# Patient Record
Sex: Female | Born: 1995 | Race: White | Hispanic: No | Marital: Single | Smoking: Never smoker
Health system: Southern US, Community
[De-identification: ages and names within clinical notes are randomized; demographics above are authoritative.]

## PROBLEM LIST (undated history)

## (undated) DIAGNOSIS — F909 Attention-deficit hyperactivity disorder, unspecified type: Secondary | ICD-10-CM

## (undated) DIAGNOSIS — F419 Anxiety disorder, unspecified: Secondary | ICD-10-CM

## (undated) HISTORY — PX: WISDOM TOOTH EXTRACTION: SHX21

## (undated) HISTORY — DX: Attention-deficit hyperactivity disorder, unspecified type: F90.9

---
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
## 2017-01-11 ENCOUNTER — Encounter: Payer: Self-pay | Admitting: Family Medicine

## 2017-01-11 ENCOUNTER — Ambulatory Visit (INDEPENDENT_AMBULATORY_CARE_PROVIDER_SITE_OTHER): Payer: Commercial Managed Care - PPO | Admitting: Family Medicine

## 2017-01-11 VITALS — BP 135/77 | HR 104 | Temp 97.9°F | Ht 66.0 in | Wt 155.0 lb

## 2017-01-11 DIAGNOSIS — J020 Streptococcal pharyngitis: Secondary | ICD-10-CM

## 2017-01-11 LAB — RAPID STREP SCREEN (MED CTR MEBANE ONLY): STREP GP A AG, IA W/REFLEX: POSITIVE — AB

## 2017-01-11 MED ORDER — AMOXICILLIN 500 MG PO CAPS: 500.0000 mg | ORAL_CAPSULE | Freq: Two times a day (BID) | ORAL | 0 refills | Status: AC

## 2017-01-11 NOTE — Progress Notes (Signed)
Subjective: CC: flu like symptoms PCP: Bennie PieriniMartin, Mary-Margaret, FNP WUJ:[id number]:Ruth Petty is a 22 y.o. female, who is accompanied today by her father. She is presenting to clinic today for:  1. Sore throat Patient reports acute onset of sore throat, sinus pressure, and congestion yesterday.  She denies nausea, vomiting, diarrhea, sick contacts, fevers, chills, rashes, cough, dizziness.  She has not taken anything for symptoms yet.  No history of recurrent strep throat.  She is not a smoker.  No smoke exposure.  She is hydrating well and swallowing without difficulty.   ROS: Per HPI  No Known Allergies Past Medical History:  Diagnosis Date  . ADHD (attention deficit hyperactivity disorder)     Current Outpatient Medications:  .  adapalene (DIFFERIN) 0.1 % cream, , Disp: , Rfl: 3 .  amphetamine-dextroamphetamine (ADDERALL XR) 30 MG 24 hr capsule, Take  1 capsule at 7am and one at 1pm daily, Disp: 60 capsule, Rfl: 0 .  amphetamine-dextroamphetamine (ADDERALL XR) 30 MG 24 hr capsule, Take  1 capsule at 7am and one at 1pm daily, Disp: 60 capsule, Rfl: 0 .  amphetamine-dextroamphetamine (ADDERALL XR) 30 MG 24 hr capsule, Take  1 capsule at 7am and one at 1pm daily, Disp: 60 capsule, Rfl: 0 .  busPIRone (BUSPAR) 15 MG tablet, TAKE 1 TABLET (15 MG TOTAL) BY MOUTH 2 (TWO) TIMES DAILY., Disp: 60 tablet, Rfl: 2 .  citalopram (CELEXA) 40 MG tablet, Take 1 tablet (40 mg total) by mouth daily., Disp: 90 tablet, Rfl: 1 .  norethindrone-ethinyl estradiol (JUNEL FE,GILDESS FE,LOESTRIN FE) 1-20 MG-MCG tablet, Take 1 tablet by mouth daily., Disp: 1 Package, Rfl: 5 .  spironolactone (ALDACTONE) 25 MG tablet, Take 25 mg by mouth 2 (two) times daily., Disp: , Rfl: 3 Social History   Socioeconomic History  . Marital status: Single    Spouse name: Not on file  . Number of children: Not on file  . Years of education: Not on file  . Highest education level: Not on file  Social Needs  . Financial resource  strain: Not on file  . Food insecurity - worry: Not on file  . Food insecurity - inability: Not on file  . Transportation needs - medical: Not on file  . Transportation needs - non-medical: Not on file  Occupational History  . Not on file  Tobacco Use  . Smoking status: Never Smoker  . Smokeless tobacco: Never Used  Substance and Sexual Activity  . Alcohol use: No  . Drug use: No  . Sexual activity: Not on file  Other Topics Concern  . Not on file  Social History Narrative  . Not on file   No family history on file.  Objective: Office vital signs reviewed. BP 135/77   Pulse (!) 104   Temp 97.9 F (36.6 C) (Oral)   Ht 5\' 6"  (1.676 m)   Wt 155 lb (70.3 kg)   BMI 25.02 kg/m   Physical Examination:  General: Awake, alert, well nourished, well appearing female, No acute distress HEENT: Normal    Neck: No masses palpated. No lymphadenopathy    Ears: Tympanic membranes intact, normal light reflex, no erythema, no bulging    Eyes: PERRLA, extraocular membranes intact, sclera white, no ocular discharge    Nose: nasal turbinates moist, clear nasal discharge    Throat: moist mucus membranes, mild oropharyngeal erythema, no tonsillar exudate.  Airway is patent Cardio: slightly tachycardic and rhythm, S1S2 heard, no murmurs appreciated Pulm: clear to auscultation bilaterally,  no wheezes, rhonchi or rales; normal work of breathing on room air Skin: good turgor, no rashes  Assessment/ Plan: 22 y.o. female   1. Strep pharyngitis Rapid strep is positive.  Patient is nontoxic.  She actually had a positive strep in September as well.  I did discuss with her she continues to have positive strep throat, this would be an indication to refer to ENT for consideration of removal of tonsils.  She voiced understanding.  She has been prescribed amoxicillin 500 mg twice daily for 10 days.  She will follow-up as needed. - Rapid Strep Screen (Not at [facility])   Orders Placed This Encounter    Procedures  . Rapid Strep Screen (Not at [facility])   Meds ordered this encounter  Medications  . amoxicillin (AMOXIL) 500 MG capsule    Sig: Take 1 capsule (500 mg total) by mouth 2 (two) times daily for 10 days.    Dispense:  20 capsule    Refill:  0   Strict return precautions and reasons for emergent evaluation in the emergency department review with patient.  They voiced understanding and will follow-up as needed.    Raliegh Ip, DO [facility] [phone]

## 2017-01-11 NOTE — Patient Instructions (Addendum)
Your strep test was positive.  This infection is highly contagious.  You will be considered not contagious after you have taken antibiotics for 24 hours.  I have sent in antibiotics for you to use.  You may use ibuprofen or naproxen if needed for fever or sore throat.  Make sure that you take this medication with food and water.    Strep Throat Strep throat is a bacterial infection of the throat. Your health care provider may call the infection tonsillitis or pharyngitis, depending on whether there is swelling in the tonsils or at the back of the throat. Strep throat is most common during the cold months of the year in children who are 175-20 years of age, but it can happen during any season in people of any age. This infection is spread from person to person (contagious) through coughing, sneezing, or close contact. What are the causes? Strep throat is caused by the bacteria called Streptococcus pyogenes. What increases the risk? This condition is more likely to develop in:  People who spend time in crowded places where the infection can spread easily.  People who have close contact with someone who has strep throat.  What are the signs or symptoms? Symptoms of this condition include:  Fever or chills.  Redness, swelling, or pain in the tonsils or throat.  Pain or difficulty when swallowing.  White or yellow spots on the tonsils or throat.  Swollen, tender glands in the neck or under the jaw.  Red rash all over the body (rare).  How is this diagnosed? This condition is diagnosed by performing a rapid strep test or by taking a swab of your throat (throat culture test). Results from a rapid strep test are usually ready in a few minutes, but throat culture test results are available after one or two days. How is this treated? This condition is treated with antibiotic medicine. Follow these instructions at home: Medicines  Take over-the-counter and prescription medicines only as told  by your health care provider.  Take your antibiotic as told by your health care provider. Do not stop taking the antibiotic even if you start to feel better.  Have family members who also have a sore throat or fever tested for strep throat. They may need antibiotics if they have the strep infection. Eating and drinking  Do not share food, drinking cups, or personal items that could cause the infection to spread to other people.  If swallowing is difficult, try eating soft foods until your sore throat feels better.  Drink enough fluid to keep your urine clear or pale yellow. General instructions  Gargle with a salt-water mixture 3-4 times per day or as needed. To make a salt-water mixture, completely dissolve -1 tsp of salt in 1 cup of warm water.  Make sure that all household members wash their hands well.  Get plenty of rest.  Stay home from school or work until you have been taking antibiotics for 24 hours.  Keep all follow-up visits as told by your health care provider. This is important. Contact a health care provider if:  The glands in your neck continue to get bigger.  You develop a rash, cough, or earache.  You cough up a thick liquid that is green, yellow-brown, or bloody.  You have pain or discomfort that does not get better with medicine.  Your problems seem to be getting worse rather than better.  You have a fever. Get help right away if:  You have new  symptoms, such as vomiting, severe headache, stiff or painful neck, chest pain, or shortness of breath.  You have severe throat pain, drooling, or changes in your voice.  You have swelling of the neck, or the skin on the neck becomes red and tender.  You have signs of dehydration, such as fatigue, dry mouth, and decreased urination.  You become increasingly sleepy, or you cannot wake up completely.  Your joints become red or painful. This information is not intended to replace advice given to you by your  health care provider. Make sure you discuss any questions you have with your health care provider. Document Released: 12/23/1999 Document Revised: 08/24/2015 Document Reviewed: 04/19/2014 Elsevier Interactive Patient Education  [employer].

## 2017-02-13 ENCOUNTER — Ambulatory Visit: Payer: Commercial Managed Care - PPO | Admitting: Physician Assistant

## 2017-02-19 ENCOUNTER — Encounter: Payer: Self-pay | Admitting: Nurse Practitioner

## 2017-03-25 ENCOUNTER — Other Ambulatory Visit: Payer: Self-pay | Admitting: Nurse Practitioner

## 2017-03-26 ENCOUNTER — Ambulatory Visit (INDEPENDENT_AMBULATORY_CARE_PROVIDER_SITE_OTHER): Payer: Commercial Managed Care - PPO | Admitting: Nurse Practitioner

## 2017-03-26 ENCOUNTER — Encounter: Payer: Self-pay | Admitting: Nurse Practitioner

## 2017-03-26 VITALS — BP 120/77 | HR 135 | Temp 97.0°F | Ht 67.0 in | Wt 157.0 lb

## 2017-03-26 DIAGNOSIS — F331 Major depressive disorder, recurrent, moderate: Secondary | ICD-10-CM | POA: Diagnosis not present

## 2017-03-26 DIAGNOSIS — F902 Attention-deficit hyperactivity disorder, combined type: Secondary | ICD-10-CM | POA: Diagnosis not present

## 2017-03-26 DIAGNOSIS — F411 Generalized anxiety disorder: Secondary | ICD-10-CM | POA: Diagnosis not present

## 2017-03-26 MED ORDER — AMPHETAMINE-DEXTROAMPHET ER 30 MG PO CP24: ORAL_CAPSULE | ORAL | 0 refills | Status: DC

## 2017-03-26 MED ORDER — CITALOPRAM HYDROBROMIDE 40 MG PO TABS: 40.0000 mg | ORAL_TABLET | Freq: Every day | ORAL | 1 refills | Status: DC

## 2017-03-26 NOTE — Progress Notes (Signed)
   Subjective:    Patient ID: Jackqulyn Livingseyton L Tarrant, female    DOB: 02/27/1995, 22 y.o.   MRN: [id number]  HPI Patient come in today accompanied by her mom for ADHD follow up. She is currently on Adderall XR 30mg  daily. Doing well. 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 Denies any side effects from medication. She is also on celexa for depression which she says is working well. Still has occasional panic attacks and she uses buspar when needed   Review of Systems  Constitutional: Negative for activity change and appetite change.  HENT: Negative.   Eyes: Negative for pain.  Respiratory: Negative for shortness of breath.   Cardiovascular: Negative for chest pain, palpitations and leg swelling.  Gastrointestinal: Negative for abdominal pain.  Endocrine: Negative for polydipsia.  Genitourinary: Negative.   Skin: Negative for rash.  Neurological: Negative for dizziness, weakness and headaches.  Hematological: Does not bruise/bleed easily.  Psychiatric/Behavioral: Negative.   All other systems reviewed and are negative.      Objective:   Physical Exam  Constitutional: She is oriented to person, place, and time. She appears well-developed and well-nourished. No distress.  Cardiovascular: Normal rate and regular rhythm.  Pulmonary/Chest: Effort normal and breath sounds normal.  Neurological: She is alert and oriented to person, place, and time.  Skin: Skin is warm.  Psychiatric: She has a normal mood and affect. Her behavior is normal. Judgment and thought content normal.   BP 120/77   Pulse (!) 135   Temp (!) 97 F (36.1 C) (Oral)   Ht 5\' 7"  (1.702 m)   Wt 157 lb (71.2 kg)   BMI 24.59 kg/m       Assessment & Plan:  1. Attention deficit hyperactivity disorder (ADHD), combined type- amphetamine-dextroamphetamine (ADDERALL XR) 30 MG 24 hr capsule; Take  1 capsule at 7am and one at 1pm daily  Dispense: 60 capsule; Refill: 0 -  amphetamine-dextroamphetamine (ADDERALL XR) 30 MG 24 hr capsule; Take  1 capsule at 7am and one at 1pm daily  Dispense: 60 capsule; Refill: 0 - amphetamine-dextroamphetamine (ADDERALL XR) 30 MG 24 hr capsule; Take  1 capsule at 7am and one at 1pm daily  Dispense: 60 capsule; Refill: 0  2. Moderate episode of recurrent major depressive disorder (HCC) Stress management  3. Generalized anxiety disorder - citalopram (CELEXA) 40 MG tablet; Take 1 tablet (40 mg total) by mouth daily.  Dispense: 90 tablet; Refill: 1     Mary-Margaret Daphine DeutscherMartin, FNP

## 2017-07-04 ENCOUNTER — Telehealth: Payer: Self-pay | Admitting: Nurse Practitioner

## 2017-07-04 NOTE — Telephone Encounter (Signed)
Pt has been getting refill from GYN appt scheduled for med refill

## 2017-07-04 NOTE — Telephone Encounter (Signed)
This is not on patients meds list- who has been giving her this

## 2017-07-05 ENCOUNTER — Encounter: Payer: Self-pay | Admitting: Nurse Practitioner

## 2017-07-05 ENCOUNTER — Ambulatory Visit (INDEPENDENT_AMBULATORY_CARE_PROVIDER_SITE_OTHER): Payer: Commercial Managed Care - PPO | Admitting: Nurse Practitioner

## 2017-07-05 VITALS — BP 112/71 | HR 104 | Temp 97.8°F | Ht 67.0 in | Wt 155.0 lb

## 2017-07-05 DIAGNOSIS — F902 Attention-deficit hyperactivity disorder, combined type: Secondary | ICD-10-CM

## 2017-07-05 DIAGNOSIS — F411 Generalized anxiety disorder: Secondary | ICD-10-CM | POA: Diagnosis not present

## 2017-07-05 DIAGNOSIS — F5104 Psychophysiologic insomnia: Secondary | ICD-10-CM

## 2017-07-05 MED ORDER — TRAZODONE HCL 50 MG PO TABS: 25.0000 mg | ORAL_TABLET | Freq: Every evening | ORAL | 3 refills | Status: DC | PRN

## 2017-07-05 MED ORDER — AMPHETAMINE-DEXTROAMPHET ER 30 MG PO CP24: ORAL_CAPSULE | ORAL | 0 refills | Status: DC

## 2017-07-05 NOTE — Progress Notes (Addendum)
   Subjective:    Patient ID: Ruth Petty, female    DOB: 01/05/1996, 22 y.o.   MRN: [id number]   Chief Complaint: Discuss Remus Lofflerambien   HPI Patient is brought in by her mom today to discuss [place]. She has been getting it from gyn. A 30 day rx was lasting her over 6 months. She has tried melatonin and that does not work. She would take [place] when she has a panic attack in the middle of night and cannot go back to sleep. This happens a couple of times a month. [place] travel she always has a panic attack and mom wanted to be ready.   * mom wants adhd meds filled while here. She is on adderall 30mg  bid. This keeps her calm and helps her to concentrate. Having  No side effects.  Review of Systems  Constitutional: Negative for activity change and appetite change.  HENT: Negative.   Eyes: Negative for pain.  Respiratory: Negative for shortness of breath.   Cardiovascular: Negative for chest pain, palpitations and leg swelling.  Gastrointestinal: Negative for abdominal pain.  Endocrine: Negative for polydipsia.  Genitourinary: Negative.   Skin: Negative for rash.  Neurological: Negative for dizziness, weakness and headaches.  Hematological: Does not bruise/bleed easily.  Psychiatric/Behavioral: Negative.   All other systems reviewed and are negative.      Objective:   Physical Exam  Constitutional: She is oriented to person, place, and time. She appears well-developed and well-nourished. No distress.  Cardiovascular: Normal rate and regular rhythm.  Pulmonary/Chest: Effort normal.  Neurological: She is alert and oriented to person, place, and time.  Skin: Skin is warm and dry.  Psychiatric: She has a normal mood and affect. Her behavior is normal. Judgment and thought content normal.   BP 112/71   Pulse (!) 104   Temp 97.8 F (36.6 C) (Oral)   Ht 5\' 7"  (1.702 m)   Wt 155 lb (70.3 kg)   BMI 24.28 kg/m        Assessment & Plan:  Ruth Petty in today with chief complaint of Discuss ambien   1. Generalized anxiety disorder Stress management - traZODone (DESYREL) 50 MG tablet; Take 0.5-1 tablets (25-50 mg total) by mouth at bedtime as needed for sleep.  Dispense: 30 tablet; Refill: 3  2. Psychophysiological insomnia Panic exercises - traZODone (DESYREL) 50 MG tablet; Take 0.5-1 tablets (25-50 mg total) by mouth at bedtime as needed for sleep.  Dispense: 30 tablet; Refill: 3  3. Attention deficit hyperactivity disorder (ADHD), combined type - amphetamine-dextroamphetamine (ADDERALL XR) 30 MG 24 hr capsule; Take  1 capsule at 7am and one at 1pm daily  Dispense: 60 capsule; Refill: 0 - amphetamine-dextroamphetamine (ADDERALL XR) 30 MG 24 hr capsule; Take  1 capsule at 7am and one at 1pm daily  Dispense: 60 capsule; Refill: 0 - amphetamine-dextroamphetamine (ADDERALL XR) 30 MG 24 hr capsule; Take  1 capsule at 7am and one at 1pm daily  Dispense: 60 capsule; Refill: 0   Mary-Margaret Daphine DeutscherMartin, FNP

## 2017-07-05 NOTE — Addendum Note (Signed)
Addended by: Bennie PieriniMARTIN, MARY-MARGARET on: 07/05/2017 08:51 AM   Modules accepted: Orders

## 2017-07-05 NOTE — Patient Instructions (Signed)
Stress and Stress Management Stress is a normal reaction to life events. It is what you feel when life demands more than you are used to or more than you can handle. Some stress can be useful. For example, the stress reaction can help you catch the last bus of the day, study for a test, or meet a deadline at work. But stress that occurs too often or for too long can cause problems. It can affect your emotional health and interfere with relationships and normal daily activities. Too much stress can weaken your immune system and increase your risk for physical illness. If you already have a medical problem, stress can make it worse. What are the causes? All sorts of life events may cause stress. An event that causes stress for one person may not be stressful for another person. Major life events commonly cause stress. These may be positive or negative. Examples include losing your job, moving into a new home, getting married, having a baby, or losing a loved one. Less obvious life events may also cause stress, especially if they occur day after day or in combination. Examples include working long hours, driving in traffic, caring for children, being in debt, or being in a difficult relationship. What are the signs or symptoms? Stress may cause emotional symptoms including, the following:  Anxiety. This is feeling worried, afraid, on edge, overwhelmed, or out of control.  Anger. This is feeling irritated or impatient.  Depression. This is feeling sad, down, helpless, or guilty.  Difficulty focusing, remembering, or making decisions.  Stress may cause physical symptoms, including the following:  Aches and pains. These may affect your head, neck, back, stomach, or other areas of your body.  Tight muscles or clenched jaw.  Low energy or trouble sleeping.  Stress may cause unhealthy behaviors, including the following:  Eating to feel better (overeating) or skipping meals.  Sleeping too little,  too much, or both.  Working too much or putting off tasks (procrastination).  Smoking, drinking alcohol, or using drugs to feel better.  How is this diagnosed? Stress is diagnosed through an assessment by your health care provider. Your health care provider will ask questions about your symptoms and any stressful life events.Your health care provider will also ask about your medical history and may order blood tests or other tests. Certain medical conditions and medicine can cause physical symptoms similar to stress. Mental illness can cause emotional symptoms and unhealthy behaviors similar to stress. Your health care provider may refer you to a mental health professional for further evaluation. How is this treated? Stress management is the recommended treatment for stress.The goals of stress management are reducing stressful life events and coping with stress in healthy ways. Techniques for reducing stressful life events include the following:  Stress identification. Self-monitor for stress and identify what causes stress for you. These skills may help you to avoid some stressful events.  Time management. Set your priorities, keep a calendar of events, and learn to say "no." These tools can help you avoid making too many commitments.  Techniques for coping with stress include the following:  Rethinking the problem. Try to think realistically about stressful events rather than ignoring them or overreacting. Try to find the positives in a stressful situation rather than focusing on the negatives.  Exercise. Physical exercise can release both physical and emotional tension. The key is to find a form of exercise you enjoy and do it regularly.  Relaxation techniques. These relax the body and  mind. Examples include yoga, meditation, tai chi, biofeedback, deep breathing, progressive muscle relaxation, listening to music, being out in nature, journaling, and other hobbies. Again, the key is to find  one or more that you enjoy and can do regularly.  Healthy lifestyle. Eat a balanced diet, get plenty of sleep, and do not smoke. Avoid using alcohol or drugs to relax.  Strong support network. Spend time with family, friends, or other people you enjoy being around.Express your feelings and talk things over with someone you trust.  Counseling or talktherapy with a mental health professional may be helpful if you are having difficulty managing stress on your own. Medicine is typically not recommended for the treatment of stress.Talk to your health care provider if you think you need medicine for symptoms of stress. Follow these instructions at home:  Keep all follow-up visits as directed by your health care provider.  Take all medicines as directed by your health care provider. Contact a health care provider if:  Your symptoms get worse or you start having new symptoms.  You feel overwhelmed by your problems and can no longer manage them on your own. Get help right away if:  You feel like hurting yourself or someone else. This information is not intended to replace advice given to you by your health care provider. Make sure you discuss any questions you have with your health care provider. Document Released: 06/20/2000 Document Revised: 06/02/2015 Document Reviewed: 08/19/2012 Elsevier Interactive Patient Education  2017 [employer] Inc[employer]  

## 2017-07-28 ENCOUNTER — Other Ambulatory Visit: Payer: Self-pay | Admitting: Nurse Practitioner

## 2017-07-28 DIAGNOSIS — F5104 Psychophysiologic insomnia: Secondary | ICD-10-CM

## 2017-07-28 DIAGNOSIS — F411 Generalized anxiety disorder: Secondary | ICD-10-CM

## 2017-09-16 ENCOUNTER — Other Ambulatory Visit: Payer: Self-pay | Admitting: Nurse Practitioner

## 2017-10-04 IMAGING — DX DG ABDOMEN 2V
2 series · 2 of 2 positions shown · non-contrast
Comparison: None

CLINICAL DATA: Low back pain for 1 month, blood in urine

EXAM:
ABDOMEN - 2 VIEW

[abdomen supine]
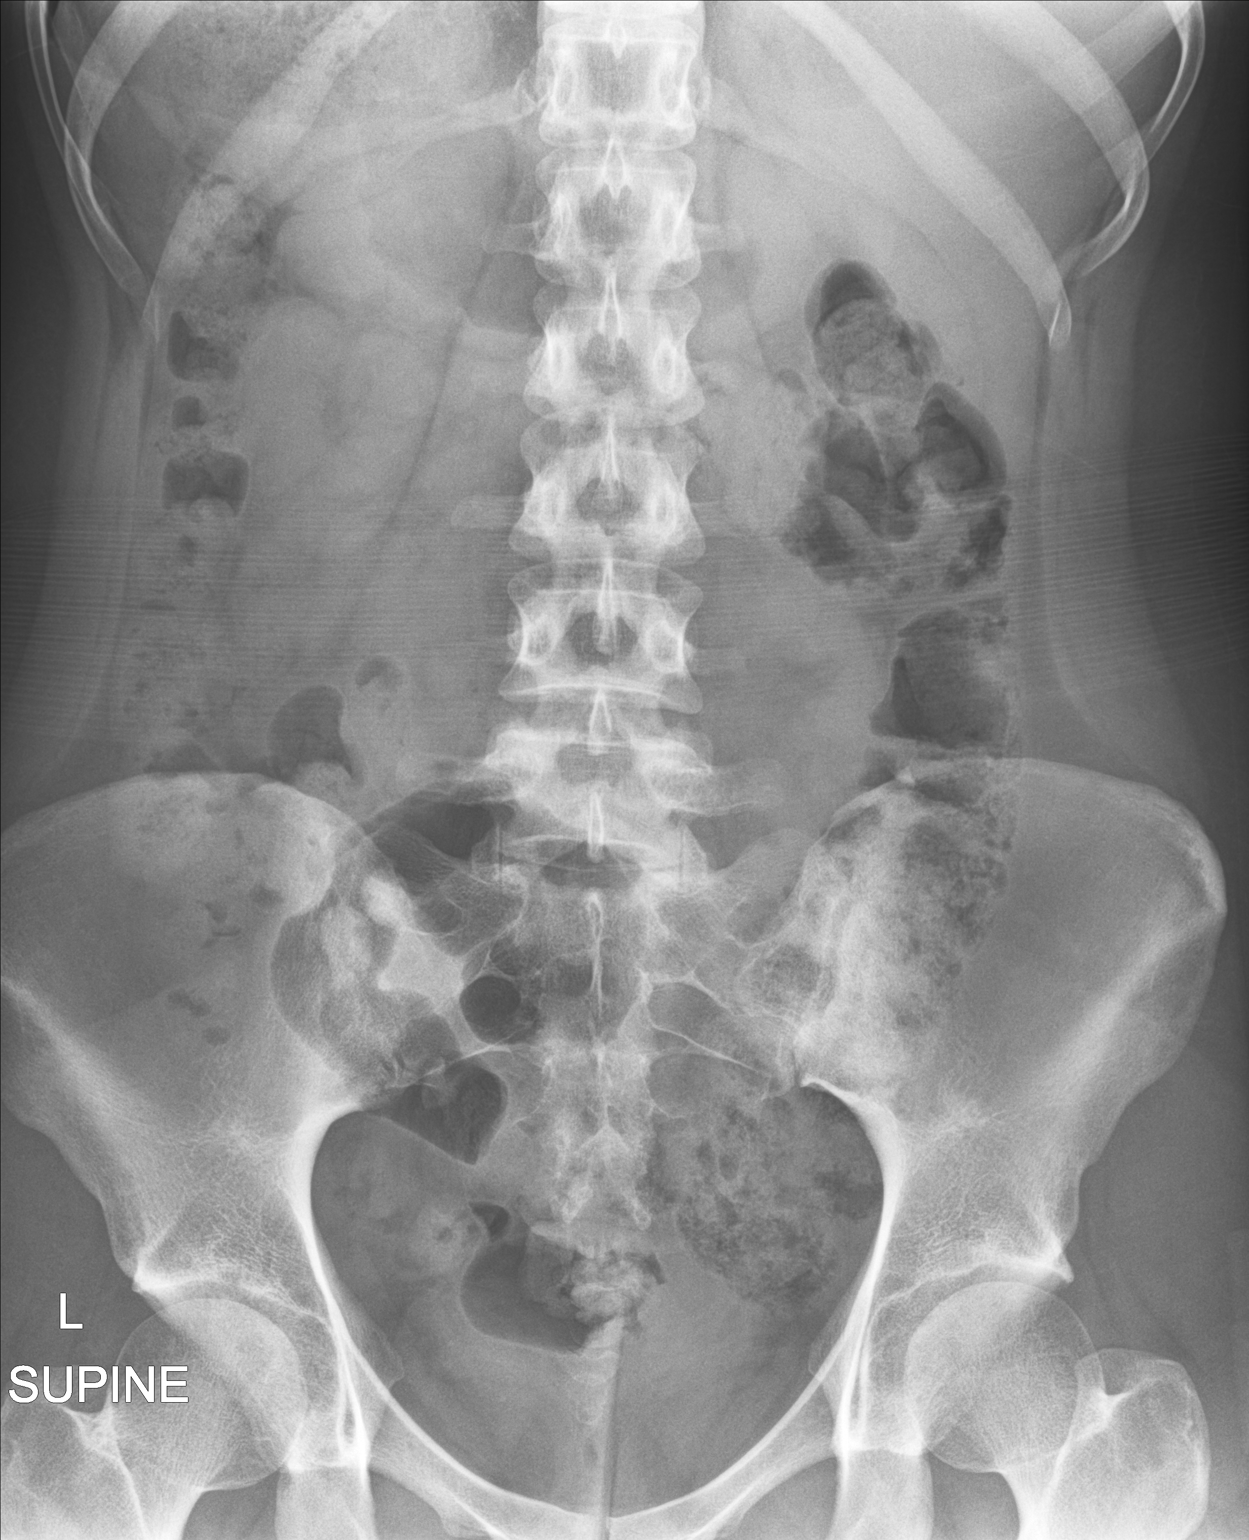

[abdomen erect]
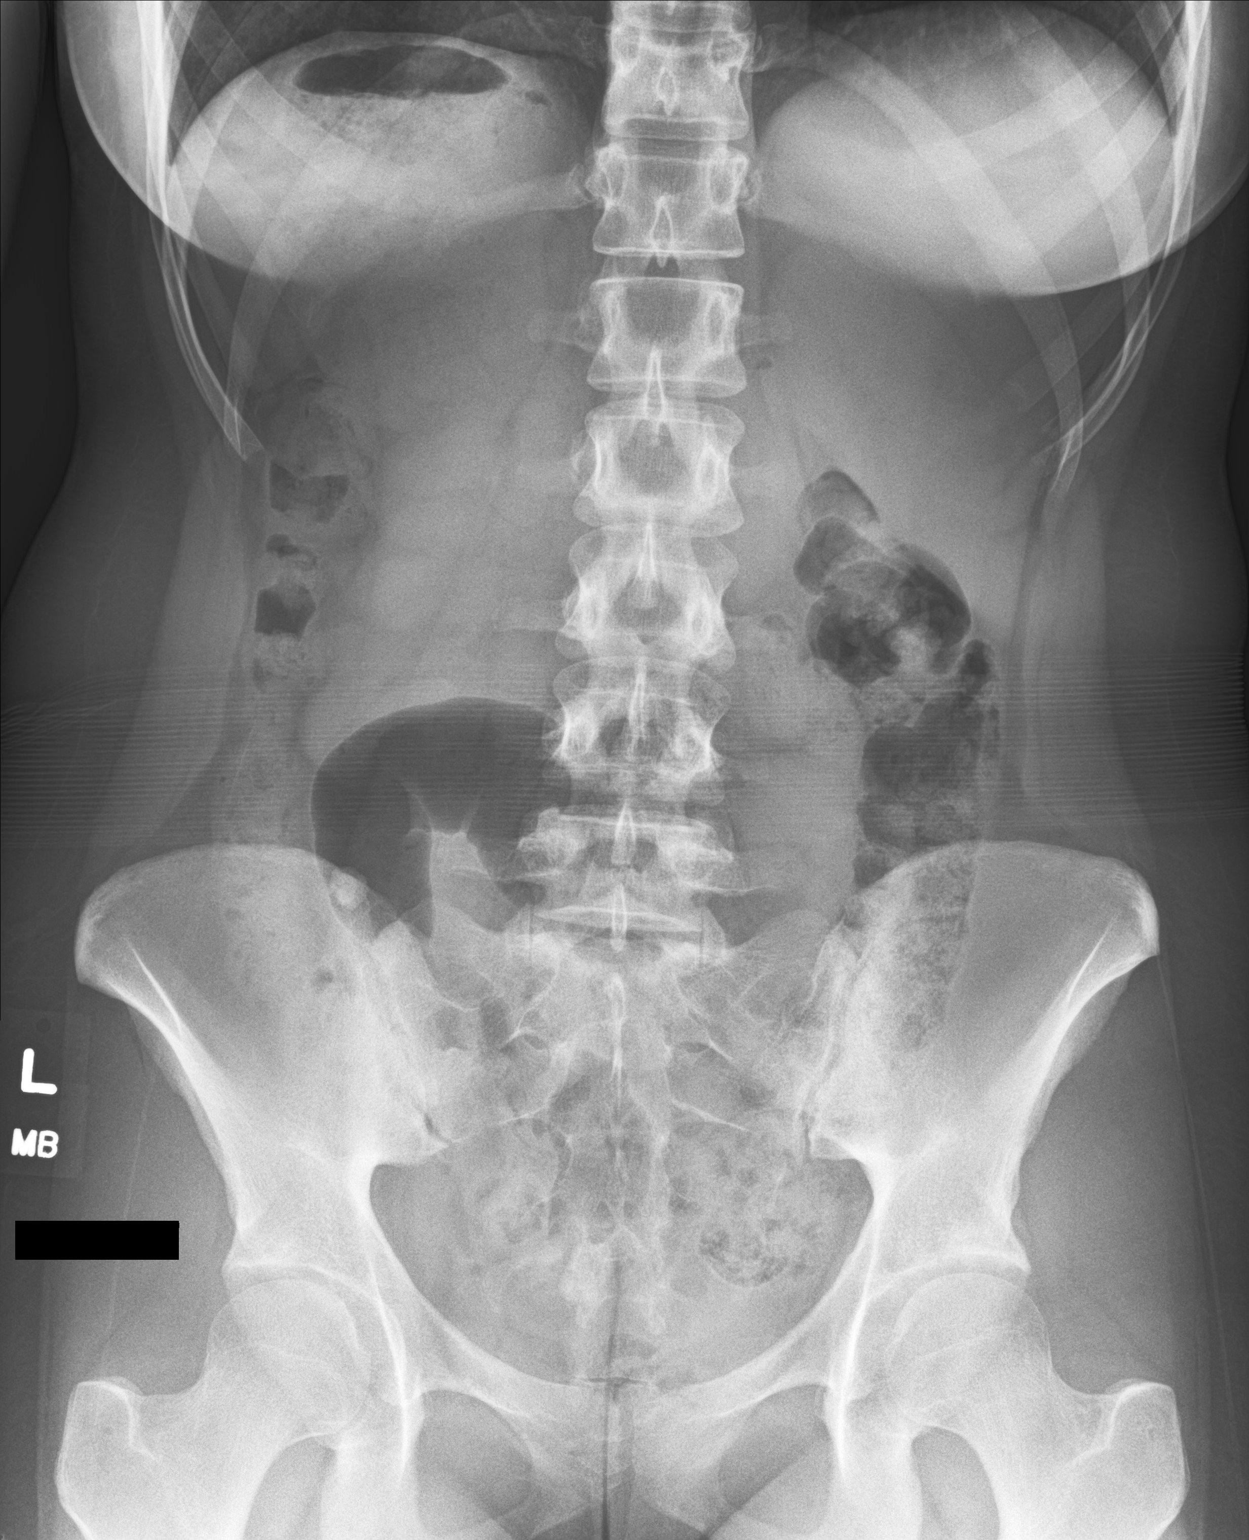

[2 of 2 positions shown; findings below may reference images not displayed]

FINDINGS: Visualized lung bases clear.

Scattered gas and stool throughout colon to rectum.

No bowel dilatation or bowel wall thickening.

Overall non obstructive bowel gas pattern.

No free intraperitoneal air.

Osseous structures normal appearance.

No urinary tract calcification.
IMPRESSION: Normal exam.

## 2017-10-24 ENCOUNTER — Other Ambulatory Visit: Payer: Self-pay | Admitting: Nurse Practitioner

## 2017-10-24 DIAGNOSIS — F5104 Psychophysiologic insomnia: Secondary | ICD-10-CM

## 2017-10-24 DIAGNOSIS — F411 Generalized anxiety disorder: Secondary | ICD-10-CM

## 2017-10-24 NOTE — Telephone Encounter (Signed)
Last seen 07/05/17  MMM 

## 2017-11-18 ENCOUNTER — Other Ambulatory Visit: Payer: Self-pay | Admitting: Nurse Practitioner

## 2017-12-04 ENCOUNTER — Other Ambulatory Visit: Payer: Self-pay | Admitting: Nurse Practitioner

## 2017-12-04 DIAGNOSIS — F411 Generalized anxiety disorder: Secondary | ICD-10-CM

## 2017-12-10 ENCOUNTER — Encounter: Payer: Self-pay | Admitting: Family Medicine

## 2017-12-10 ENCOUNTER — Ambulatory Visit (INDEPENDENT_AMBULATORY_CARE_PROVIDER_SITE_OTHER): Payer: Commercial Managed Care - PPO | Admitting: Family Medicine

## 2017-12-10 VITALS — BP 114/72 | HR 89 | Temp 98.9°F | Resp 18 | Ht 67.0 in | Wt 149.0 lb

## 2017-12-10 DIAGNOSIS — J029 Acute pharyngitis, unspecified: Secondary | ICD-10-CM

## 2017-12-10 DIAGNOSIS — J028 Acute pharyngitis due to other specified organisms: Secondary | ICD-10-CM | POA: Diagnosis not present

## 2017-12-10 DIAGNOSIS — J069 Acute upper respiratory infection, unspecified: Secondary | ICD-10-CM

## 2017-12-10 LAB — CULTURE, GROUP A STREP

## 2017-12-10 LAB — RAPID STREP SCREEN (MED CTR MEBANE ONLY): Strep Gp A Ag, IA W/Reflex: NEGATIVE

## 2017-12-10 MED ORDER — AMOXICILLIN 875 MG PO TABS: 875.0000 mg | ORAL_TABLET | Freq: Two times a day (BID) | ORAL | 0 refills | Status: DC

## 2017-12-10 MED ORDER — PSEUDOEPH-BROMPHEN-DM 30-2-10 MG/5ML PO SYRP: 5.0000 mL | ORAL_SOLUTION | Freq: Four times a day (QID) | ORAL | 0 refills | Status: DC | PRN

## 2017-12-10 NOTE — Patient Instructions (Signed)
Viral Respiratory Infection A viral respiratory infection is an illness that affects parts of the body used for breathing, like the lungs, nose, and throat. It is caused by a germ called a virus. Some examples of this kind of infection are:  A cold.  The flu (influenza).  A respiratory syncytial virus (RSV) infection.  How do I know if I have this infection? Most of the time this infection causes:  A stuffy or runny nose.  Yellow or green fluid in the nose.  A cough.  Sneezing.  Tiredness (fatigue).  Achy muscles.  A sore throat.  Sweating or chills.  A fever.  A headache.  How is this infection treated? If the flu is diagnosed early, it may be treated with an antiviral medicine. This medicine shortens the length of time a person has symptoms. Symptoms may be treated with over-the-counter and prescription medicines, such as:  Expectorants. These make it easier to cough up mucus.  Decongestant nasal sprays.  Doctors do not prescribe antibiotic medicines for viral infections. They do not work with this kind of infection. How do I know if I should stay home? To keep others from getting sick, stay home if you have:  A fever.  A lasting cough.  A sore throat.  A runny nose.  Sneezing.  Muscles aches.  Headaches.  Tiredness.  Weakness.  Chills.  Sweating.  An upset stomach (nausea).  Follow these instructions at home:  Rest as much as possible.  Take over-the-counter and prescription medicines only as told by your doctor.  Drink enough fluid to keep your pee (urine) clear or pale yellow.  Gargle with salt water. Do this 3-4 times per day or as needed. To make a salt-water mixture, dissolve -1 tsp of salt in 1 cup of warm water. Make sure the salt dissolves all the way.  Use nose drops made from salt water. This helps with stuffiness (congestion). It also helps soften the skin around your nose.  Do not drink alcohol.  Do not use tobacco  products, including cigarettes, chewing tobacco, and e-cigarettes. If you need help quitting, ask your doctor. Get help if:  Your symptoms last for 10 days or longer.  Your symptoms get worse over time.  You have a fever.  You have very bad pain in your face or forehead.  Parts of your jaw or neck become very swollen. Get help right away if:  You feel pain or pressure in your chest.  You have shortness of breath.  You faint or feel like you will faint.  You keep throwing up (vomiting).  You feel confused. This information is not intended to replace advice given to you by your health care provider. Make sure you discuss any questions you have with your health care provider. Document Released: 12/08/2007 Document Revised: 06/02/2015 Document Reviewed: 06/02/2014 Elsevier Interactive Patient Education  2018 [employer]  Pharyngitis Pharyngitis is a sore throat (pharynx). There is redness, pain, and swelling of your throat. Follow these instructions at home:  Drink enough fluids to keep your pee (urine) clear or pale yellow.  Only take medicine as told by your doctor. ? You may get sick again if you do not take medicine as told. Finish your medicines, even if you start to feel better. ? Do not take aspirin.  Rest.  Rinse your mouth (gargle) with salt water ( tsp of salt per 1 qt of water) every 1-2 hours. This will help the pain.  If you are not   at risk for choking, you can suck on hard candy or sore throat lozenges. Contact a doctor if:  You have large, tender lumps on your neck.  You have a rash.  You cough up green, yellow-brown, or bloody spit. Get help right away if:  You have a stiff neck.  You drool or cannot swallow liquids.  You throw up (vomit) or are not able to keep medicine or liquids down.  You have very bad pain that does not go away with medicine.  You have problems breathing (not from a stuffy nose). This information is not intended to  replace advice given to you by your health care provider. Make sure you discuss any questions you have with your health care provider. Document Released: 06/13/2007 Document Revised: 06/02/2015 Document Reviewed: 09/01/2012 Elsevier Interactive Patient Education  2017 [employer].  

## 2017-12-10 NOTE — Progress Notes (Signed)
Subjective:    Patient ID: Ruth Petty, female    DOB: 08/22/1995, 22 y.o.   MRN: [id number]  Chief Complaint:  Sore Throat, cough (x 2 days)   HPI: Ruth Petty is a 22 y.o. female presenting on 12/10/2017 for Sore Throat, cough (x 2 days)  Pt presents today with a 4 day history of sore throat, fever, and chills. States 2 days ago she started having cough, rhinorrhea, and congestion with the sore throat. States it is hard to swallow due to a burning pain. She had tried Delsym and Mucinex without relief of the symptoms.   Relevant past medical, surgical, family, and social history reviewed and updated as indicated.  Allergies and medications reviewed and updated.   Past Medical History:  Diagnosis Date  . ADHD (attention deficit hyperactivity disorder)     History reviewed. No pertinent surgical history.  Social History   Socioeconomic History  . Marital status: Single    Spouse name: Not on file  . Number of children: Not on file  . Years of education: Not on file  . Highest education level: Not on file  Occupational History  . Not on file  Social Needs  . Financial resource strain: Not on file  . Food insecurity:    Worry: Not on file    Inability: Not on file  . Transportation needs:    Medical: Not on file    Non-medical: Not on file  Tobacco Use  . Smoking status: Never Smoker  . Smokeless tobacco: Never Used  Substance and Sexual Activity  . Alcohol use: No  . Drug use: No  . Sexual activity: Not on file  Lifestyle  . Physical activity:    Days per week: Not on file    Minutes per session: Not on file  . Stress: Not on file  Relationships  . Social connections:    Talks on phone: Not on file    Gets together: Not on file    Attends religious service: Not on file    Active member of club or organization: Not on file    Attends meetings of clubs or organizations: Not on file    Relationship status: Not on file  . Intimate partner  violence:    Fear of current or ex partner: Not on file    Emotionally abused: Not on file    Physically abused: Not on file    Forced sexual activity: Not on file  Other Topics Concern  . Not on file  Social History Narrative  . Not on file    Outpatient Encounter Medications as of 12/10/2017  Medication Sig  . amphetamine-dextroamphetamine (ADDERALL XR) 30 MG 24 hr capsule Take  1 capsule at 7am and one at 1pm daily  . amphetamine-dextroamphetamine (ADDERALL XR) 30 MG 24 hr capsule Take  1 capsule at 7am and one at 1pm daily  . amphetamine-dextroamphetamine (ADDERALL XR) 30 MG 24 hr capsule Take  1 capsule at 7am and one at 1pm daily  . busPIRone (BUSPAR) 15 MG tablet TAKE (1) TABLET TWICE A DAY.  . citalopram (CELEXA) 40 MG tablet Take 1 tablet (40 mg total) by mouth daily. (Needs to be seen before next refill)  . norethindrone-ethinyl estradiol (JUNEL FE,GILDESS FE,LOESTRIN FE) 1-20 MG-MCG tablet Take 1 tablet by mouth daily.  Marland Kitchen spironolactone (ALDACTONE) 25 MG tablet Take 25 mg by mouth 2 (two) times daily.  . traZODone (DESYREL) 50 MG tablet TAKE 1/2 TO 1  TABLET (25-50 MG TOTAL) BY MOUTH AT BEDTIME AS NEEDED FOR SLEEP.  Marland Kitchen. amoxicillin (AMOXIL) 875 MG tablet Take 1 tablet (875 mg total) by mouth 2 (two) times daily. 1 po BID  . brompheniramine-pseudoephedrine-DM 30-2-10 MG/5ML syrup Take 5 mLs by mouth 4 (four) times daily as needed.  . [DISCONTINUED] adapalene (DIFFERIN) 0.1 % cream    No facility-administered encounter medications on file as of 12/10/2017.     No Known Allergies  Review of Systems  Constitutional: Positive for chills, fatigue and fever.  HENT: Positive for congestion, postnasal drip, rhinorrhea, sore throat and trouble swallowing.   Respiratory: Positive for cough. Negative for chest tightness and shortness of breath.   Cardiovascular: Negative for chest pain, palpitations and leg swelling.  Gastrointestinal: Negative for abdominal pain, diarrhea, nausea and  vomiting.  Musculoskeletal: Negative for myalgias.  Neurological: Negative for dizziness, weakness, light-headedness and headaches.  Psychiatric/Behavioral: Negative for confusion.  All other systems reviewed and are negative.       Objective:    BP 114/72   Pulse 89   Temp 98.9 F (37.2 C) (Oral)   Resp 18   Ht 5\' 7"  (1.702 m)   Wt 149 lb (67.6 kg)   BMI 23.34 kg/m    Wt Readings from Last 3 Encounters:  12/10/17 149 lb (67.6 kg)  07/05/17 155 lb (70.3 kg)  03/26/17 157 lb (71.2 kg)    Physical Exam  Constitutional: She is oriented to person, place, and time. She appears well-developed and well-nourished. She is cooperative. No distress.  HENT:  Head: Normocephalic and atraumatic.  Right Ear: Hearing, external ear and ear canal normal. Tympanic membrane is not perforated and not erythematous. A middle ear effusion is present.  Left Ear: Hearing, external ear and ear canal normal. Tympanic membrane is not perforated and not erythematous. A middle ear effusion is present.  Nose: Rhinorrhea present.  Mouth/Throat: Uvula is midline and mucous membranes are normal. Oropharyngeal exudate, posterior oropharyngeal edema and posterior oropharyngeal erythema present. No tonsillar abscesses. Tonsillar exudate.  Eyes: Pupils are equal, round, and reactive to light. Conjunctivae, EOM and lids are normal.  Neck: Trachea normal and phonation normal. Neck supple. No thyroid mass and no thyromegaly present.  Cardiovascular: Normal rate, regular rhythm and normal heart sounds. Exam reveals no gallop and no friction rub.  No murmur heard. Pulmonary/Chest: Effort normal and breath sounds normal. No respiratory distress.  Lymphadenopathy:       Head (right side): Tonsillar adenopathy present.       Head (left side): Tonsillar adenopathy present.  Neurological: She is alert and oriented to person, place, and time.  Skin: Skin is warm and dry. Capillary refill takes less than 2 seconds.    Psychiatric: She has a normal mood and affect. Her behavior is normal. Judgment and thought content normal.  Nursing note and vitals reviewed.   Results for orders placed or performed in visit on 01/11/17  Rapid Strep Screen (Not at [facility])  Result Value Ref Range   Strep Gp A Ag, IA W/Reflex Positive (A) Negative     Strep in office negative.   Pertinent labs & imaging results that were available during my care of the patient were reviewed by me and considered in my medical decision making.  Assessment & Plan:  [name] was seen today for sore throat, cough.  Diagnoses and all orders for this visit:  Sore throat -     Rapid Strep Screen ([facility] ONLY)  Upper respiratory  infection with cough and congestion Increase fluid intake. Increase humidity in the air. Tylenol as needed for fever and pain control. Throat lozenges if beneficial. Medications as prescribed.  -     amoxicillin (AMOXIL) 875 MG tablet; Take 1 tablet (875 mg total) by mouth 2 (two) times daily. 1 po BID -     brompheniramine-pseudoephedrine-DM 30-2-10 MG/5ML syrup; Take 5 mLs by mouth 4 (four) times daily as needed.  Pharyngitis due to other organism Warm salt water gargles. Tylenol as needed for fever and pain control. Throat lozenges if helpful. Medications as prescribed.  -     amoxicillin (AMOXIL) 875 MG tablet; Take 1 tablet (875 mg total) by mouth 2 (two) times daily. 1 po BID   Continue all other maintenance medications.  Follow up plan: Return if symptoms worsen or fail to improve.  Educational handout given for pharyngitis and URI  The above assessment and management plan was discussed with the patient. The patient verbalized understanding of and has agreed to the management plan. Patient is aware to call the clinic if symptoms persist or worsen. Patient is aware when to return to the clinic for a follow-up visit. Patient educated on when it is appropriate to go to the emergency department.    Kari Baars, FNP-C [facility] [phone]

## 2017-12-17 ENCOUNTER — Other Ambulatory Visit: Payer: Self-pay | Admitting: Nurse Practitioner

## 2017-12-18 NOTE — Telephone Encounter (Signed)
Pt scheduled follow up with MMM 12/13 at 11:00.

## 2017-12-18 NOTE — Telephone Encounter (Signed)
MMM NTBS 30 days given 11/19/17

## 2017-12-20 ENCOUNTER — Encounter: Payer: Self-pay | Admitting: Nurse Practitioner

## 2017-12-20 ENCOUNTER — Ambulatory Visit (INDEPENDENT_AMBULATORY_CARE_PROVIDER_SITE_OTHER): Payer: Commercial Managed Care - PPO | Admitting: Nurse Practitioner

## 2017-12-20 VITALS — BP 106/75 | HR 116 | Temp 97.6°F | Ht 67.75 in | Wt 149.0 lb

## 2017-12-20 DIAGNOSIS — F5104 Psychophysiologic insomnia: Secondary | ICD-10-CM

## 2017-12-20 DIAGNOSIS — F331 Major depressive disorder, recurrent, moderate: Secondary | ICD-10-CM

## 2017-12-20 DIAGNOSIS — F902 Attention-deficit hyperactivity disorder, combined type: Secondary | ICD-10-CM | POA: Diagnosis not present

## 2017-12-20 DIAGNOSIS — F411 Generalized anxiety disorder: Secondary | ICD-10-CM | POA: Diagnosis not present

## 2017-12-20 MED ORDER — AMPHETAMINE-DEXTROAMPHET ER 30 MG PO CP24: ORAL_CAPSULE | ORAL | 0 refills | Status: DC

## 2017-12-20 MED ORDER — TRAZODONE HCL 50 MG PO TABS: ORAL_TABLET | ORAL | 1 refills | Status: DC

## 2017-12-20 MED ORDER — CITALOPRAM HYDROBROMIDE 40 MG PO TABS: 40.0000 mg | ORAL_TABLET | Freq: Every day | ORAL | 1 refills | Status: DC

## 2017-12-20 NOTE — Progress Notes (Signed)
Subjective:    Patient ID: Ruth Petty, female    DOB: May 22, 1995, 22 y.o.   MRN: [id number]   Chief Complaint: Recheck ADHD   HPI:  1. Generalized anxiety disorder  Has always had anxiety. But is doing better celexa  2. Moderate episode of recurrent major depressive disorder (HCC)  Is on celexa daly and is really helping. Has not had panic attack in several months.  3. Attention deficit hyperactivity disorder (ADHD), combined type  Patient is on adderall daily and is working well for her.     Outpatient Encounter Medications as of 12/20/2017  Medication Sig  . busPIRone (BUSPAR) 15 MG tablet TAKE (1) TABLET TWICE A DAY.  . citalopram (CELEXA) 40 MG tablet Take 1 tablet (40 mg total) by mouth daily. (Needs to be seen before next refill)  . JUNEL 1.5/30 1.5-30 MG-MCG tablet Take 1 tablet by mouth daily.  Marland Kitchen spironolactone (ALDACTONE) 25 MG tablet Take 25 mg by mouth 2 (two) times daily.  . traZODone (DESYREL) 50 MG tablet TAKE 1/2 TO 1 TABLET (25-50 MG TOTAL) BY MOUTH AT BEDTIME AS NEEDED FOR SLEEP.  Marland Kitchen amphetamine-dextroamphetamine (ADDERALL XR) 30 MG 24 hr capsule Take  1 capsule at 7am and one at 1pm daily  . amphetamine-dextroamphetamine (ADDERALL XR) 30 MG 24 hr capsule Take  1 capsule at 7am and one at 1pm daily  . amphetamine-dextroamphetamine (ADDERALL XR) 30 MG 24 hr capsule Take  1 capsule at 7am and one at 1pm daily       New complaints: None today  Social history: Always comes here with her  Mom.  Review of Systems  Constitutional: Negative for activity change and appetite change.  HENT: Negative.   Eyes: Negative for pain.  Respiratory: Negative for shortness of breath.   Cardiovascular: Negative for chest pain, palpitations and leg swelling.  Gastrointestinal: Negative for abdominal pain.  Endocrine: Negative for polydipsia.  Genitourinary: Negative.   Skin: Negative for rash.  Neurological: Negative for dizziness, weakness and headaches.    Hematological: Does not bruise/bleed easily.  Psychiatric/Behavioral: Negative.   All other systems reviewed and are negative.      Objective:   Physical Exam Vitals signs and nursing note reviewed.  Constitutional:      General: She is not in acute distress.    Appearance: Normal appearance. She is well-developed.  HENT:     Head: Normocephalic.     Nose: Nose normal.  Eyes:     Pupils: Pupils are equal, round, and reactive to light.  Neck:     Musculoskeletal: Normal range of motion and neck supple.     Vascular: No carotid bruit or JVD.  Cardiovascular:     Rate and Rhythm: Normal rate and regular rhythm.     Heart sounds: Normal heart sounds.  Pulmonary:     Effort: Pulmonary effort is normal. No respiratory distress.     Breath sounds: Normal breath sounds. No wheezing or rales.  Chest:     Chest wall: No tenderness.  Abdominal:     General: Bowel sounds are normal. There is no distension or abdominal bruit.     Palpations: Abdomen is soft. There is no hepatomegaly, splenomegaly, mass or pulsatile mass.     Tenderness: There is no abdominal tenderness.  Musculoskeletal: Normal range of motion.  Lymphadenopathy:     Cervical: No cervical adenopathy.  Skin:    General: Skin is warm and dry.  Neurological:     Mental Status: She  is alert and oriented to person, place, and time.     Deep Tendon Reflexes: Reflexes are normal and symmetric.  Psychiatric:        Behavior: Behavior normal.        Thought Content: Thought content normal.        Judgment: Judgment normal.    BP 106/75   Pulse (!) 116   Temp 97.6 F (36.4 C) (Oral)   Ht 5' 7.75" (1.721 m)   Wt 149 lb (67.6 kg)   BMI 22.82 kg/m         Assessment & Plan:  Ruth Petty comes in today with chief complaint of Recheck ADHD   Diagnosis and orders addressed:  1. Generalized anxiety disorder Stress management - citalopram (CELEXA) 40 MG tablet; Take 1 tablet (40 mg total) by mouth daily.  (Needs to be seen before next refill)  Dispense: 90 tablet; Refill: 1 - traZODone (DESYREL) 50 MG tablet; TAKE 1/2 TO 1 TABLET (25-50 MG TOTAL) BY MOUTH AT BEDTIME AS NEEDED FOR SLEEP.  Dispense: 90 tablet; Refill: 1  2. Moderate episode of recurrent major depressive disorder (HCC)  3. Attention deficit hyperactivity disorder (ADHD), combined type - amphetamine-dextroamphetamine (ADDERALL XR) 30 MG 24 hr capsule; Take  1 capsule at 7am and one at 1pm daily  Dispense: 60 capsule; Refill: 0 - amphetamine-dextroamphetamine (ADDERALL XR) 30 MG 24 hr capsule; Take  1 capsule at 7am and one at 1pm daily  Dispense: 60 capsule; Refill: 0 - amphetamine-dextroamphetamine (ADDERALL XR) 30 MG 24 hr capsule; Take  1 capsule at 7am and one at 1pm daily  Dispense: 60 capsule; Refill: 0  4. Psychophysiological insomnia Bedtime routine - traZODone (DESYREL) 50 MG tablet; TAKE 1/2 TO 1 TABLET (25-50 MG TOTAL) BY MOUTH AT BEDTIME AS NEEDED FOR SLEEP.  Dispense: 90 tablet; Refill: 1   Labs pending Health Maintenance reviewed Diet and exercise encouraged  Follow up plan: 3 months   Mary-Margaret Daphine DeutscherMartin, FNP

## 2017-12-20 NOTE — Patient Instructions (Signed)
Stress and Stress Management Stress is a normal reaction to life events. It is what you feel when life demands more than you are used to or more than you can handle. Some stress can be useful. For example, the stress reaction can help you catch the last bus of the day, study for a test, or meet a deadline at work. But stress that occurs too often or for too long can cause problems. It can affect your emotional health and interfere with relationships and normal daily activities. Too much stress can weaken your immune system and increase your risk for physical illness. If you already have a medical problem, stress can make it worse. What are the causes? All sorts of life events may cause stress. An event that causes stress for one person may not be stressful for another person. Major life events commonly cause stress. These may be positive or negative. Examples include losing your job, moving into a new home, getting married, having a baby, or losing a loved one. Less obvious life events may also cause stress, especially if they occur day after day or in combination. Examples include working long hours, driving in traffic, caring for children, being in debt, or being in a difficult relationship. What are the signs or symptoms? Stress may cause emotional symptoms including, the following:  Anxiety. This is feeling worried, afraid, on edge, overwhelmed, or out of control.  Anger. This is feeling irritated or impatient.  Depression. This is feeling sad, down, helpless, or guilty.  Difficulty focusing, remembering, or making decisions.  Stress may cause physical symptoms, including the following:  Aches and pains. These may affect your head, neck, back, stomach, or other areas of your body.  Tight muscles or clenched jaw.  Low energy or trouble sleeping.  Stress may cause unhealthy behaviors, including the following:  Eating to feel better (overeating) or skipping meals.  Sleeping too little,  too much, or both.  Working too much or putting off tasks (procrastination).  Smoking, drinking alcohol, or using drugs to feel better.  How is this diagnosed? Stress is diagnosed through an assessment by your health care provider. Your health care provider will ask questions about your symptoms and any stressful life events.Your health care provider will also ask about your medical history and may order blood tests or other tests. Certain medical conditions and medicine can cause physical symptoms similar to stress. Mental illness can cause emotional symptoms and unhealthy behaviors similar to stress. Your health care provider may refer you to a mental health professional for further evaluation. How is this treated? Stress management is the recommended treatment for stress.The goals of stress management are reducing stressful life events and coping with stress in healthy ways. Techniques for reducing stressful life events include the following:  Stress identification. Self-monitor for stress and identify what causes stress for you. These skills may help you to avoid some stressful events.  Time management. Set your priorities, keep a calendar of events, and learn to say "no." These tools can help you avoid making too many commitments.  Techniques for coping with stress include the following:  Rethinking the problem. Try to think realistically about stressful events rather than ignoring them or overreacting. Try to find the positives in a stressful situation rather than focusing on the negatives.  Exercise. Physical exercise can release both physical and emotional tension. The key is to find a form of exercise you enjoy and do it regularly.  Relaxation techniques. These relax the body and  mind. Examples include yoga, meditation, tai chi, biofeedback, deep breathing, progressive muscle relaxation, listening to music, being out in nature, journaling, and other hobbies. Again, the key is to find  one or more that you enjoy and can do regularly.  Healthy lifestyle. Eat a balanced diet, get plenty of sleep, and do not smoke. Avoid using alcohol or drugs to relax.  Strong support network. Spend time with family, friends, or other people you enjoy being around.Express your feelings and talk things over with someone you trust.  Counseling or talktherapy with a mental health professional may be helpful if you are having difficulty managing stress on your own. Medicine is typically not recommended for the treatment of stress.Talk to your health care provider if you think you need medicine for symptoms of stress. Follow these instructions at home:  Keep all follow-up visits as directed by your health care provider.  Take all medicines as directed by your health care provider. Contact a health care provider if:  Your symptoms get worse or you start having new symptoms.  You feel overwhelmed by your problems and can no longer manage them on your own. Get help right away if:  You feel like hurting yourself or someone else. This information is not intended to replace advice given to you by your health care provider. Make sure you discuss any questions you have with your health care provider. Document Released: 06/20/2000 Document Revised: 06/02/2015 Document Reviewed: 08/19/2012 Elsevier Interactive Patient Education  2017 [employer] Inc[employer]  

## 2017-12-27 ENCOUNTER — Ambulatory Visit (INDEPENDENT_AMBULATORY_CARE_PROVIDER_SITE_OTHER): Payer: Commercial Managed Care - PPO | Admitting: *Deleted

## 2017-12-27 DIAGNOSIS — Z23 Encounter for immunization: Secondary | ICD-10-CM | POA: Diagnosis not present

## 2017-12-27 NOTE — Progress Notes (Signed)
Pt given TDAP IM left deltoid and tolerated well.

## 2017-12-30 ENCOUNTER — Telehealth: Payer: Self-pay | Admitting: Nurse Practitioner

## 2017-12-30 MED ORDER — CEPHALEXIN 500 MG PO CAPS: 500.0000 mg | ORAL_CAPSULE | Freq: Two times a day (BID) | ORAL | 0 refills | Status: DC

## 2017-12-30 NOTE — Telephone Encounter (Signed)
Patient notified

## 2017-12-30 NOTE — Telephone Encounter (Signed)
Keflex Prescription sent to pharmacy   

## 2018-01-21 ENCOUNTER — Ambulatory Visit (INDEPENDENT_AMBULATORY_CARE_PROVIDER_SITE_OTHER): Payer: Commercial Managed Care - PPO | Admitting: Family

## 2018-01-21 ENCOUNTER — Encounter: Payer: Self-pay | Admitting: Family

## 2018-01-21 VITALS — BP 126/81 | HR 123 | Temp 98.3°F | Ht 67.75 in | Wt 148.2 lb

## 2018-01-21 DIAGNOSIS — R300 Dysuria: Secondary | ICD-10-CM

## 2018-01-21 DIAGNOSIS — R112 Nausea with vomiting, unspecified: Secondary | ICD-10-CM | POA: Diagnosis not present

## 2018-01-21 DIAGNOSIS — R000 Tachycardia, unspecified: Secondary | ICD-10-CM | POA: Diagnosis not present

## 2018-01-21 DIAGNOSIS — N3001 Acute cystitis with hematuria: Secondary | ICD-10-CM | POA: Diagnosis not present

## 2018-01-21 LAB — URINALYSIS, COMPLETE
Bilirubin, UA: NEGATIVE
Glucose, UA: NEGATIVE
Nitrite, UA: NEGATIVE
SPEC GRAV UA: 1.025 (ref 1.005–1.030)
Urobilinogen, Ur: 1 mg/dL (ref 0.2–1.0)
pH, UA: 7 (ref 5.0–7.5)

## 2018-01-21 LAB — PREGNANCY, URINE: PREG TEST UR: NEGATIVE

## 2018-01-21 LAB — MICROSCOPIC EXAMINATION: Renal Epithel, UA: NONE SEEN /hpf

## 2018-01-21 MED ORDER — NITROFURANTOIN MONOHYD MACRO 100 MG PO CAPS: 100.0000 mg | ORAL_CAPSULE | Freq: Two times a day (BID) | ORAL | 0 refills | Status: DC

## 2018-01-21 NOTE — Patient Instructions (Signed)

## 2018-01-21 NOTE — Progress Notes (Signed)
Subjective:    Patient ID: Ruth Petty, female    DOB: 11/02/1995, 23 y.o.   MRN: [id number]  Chief Complaint  Patient presents with  . nausea and vomitting   Pt presents to the office today with nausea and vomiting. She was given Cipro 2 weeks ago for a UTI, but then went to the Urgent Care on 01/04/18 and told to stop the cipro and start Bactrim. The bactrim made her "sick", so she started taking the cipro again.   She states her UTI had improved, but yesterday started noticed dysuria, nausea, and vomiting.  Dysuria   This is a recurrent problem. The current episode started 1 to 4 weeks ago. The problem occurs intermittently. The problem has been waxing and waning. The pain is mild. Associated symptoms include nausea, urgency and vomiting. Pertinent negatives include no frequency or hematuria. She has tried increased fluids for the symptoms. The treatment provided mild relief.      Review of Systems  Gastrointestinal: Positive for nausea and vomiting.  Genitourinary: Positive for dysuria and urgency. Negative for frequency and hematuria.  All other systems reviewed and are negative.      Objective:   Physical Exam Vitals signs reviewed.  Constitutional:      General: She is not in acute distress.    Appearance: She is well-developed. She is ill-appearing.  HENT:     Head: Normocephalic and atraumatic.  Eyes:     Pupils: Pupils are equal, round, and reactive to light.  Neck:     Musculoskeletal: Normal range of motion and neck supple.     Thyroid: No thyromegaly.  Cardiovascular:     Rate and Rhythm: Regular rhythm. Tachycardia present.     Heart sounds: Normal heart sounds. No murmur.  Pulmonary:     Effort: Pulmonary effort is normal. No respiratory distress.     Breath sounds: Normal breath sounds. No wheezing.  Abdominal:     General: Bowel sounds are normal. There is no distension.     Palpations: Abdomen is soft.     Tenderness: There is no abdominal  tenderness.  Musculoskeletal: Normal range of motion.        General: No tenderness.  Skin:    General: Skin is warm and dry.  Neurological:     Mental Status: She is alert and oriented to person, place, and time.     Cranial Nerves: No cranial nerve deficit.     Deep Tendon Reflexes: Reflexes are normal and symmetric.  Psychiatric:        Behavior: Behavior normal.        Thought Content: Thought content normal.        Judgment: Judgment normal.     BP 126/81   Pulse (!) 123   Temp 98.3 F (36.8 C) (Oral)   Ht 5' 7.75" (1.721 m)   Wt 148 lb 3.2 oz (67.2 kg)   BMI 22.70 kg/m      Assessment & Plan:  Ruth Petty comes in today with chief complaint of nausea and vomitting   Diagnosis and orders addressed:  1. Nausea and vomiting, intractability of vomiting not specified, unspecified vomiting type - Pregnancy, urine - Urinalysis, Complete  2. Dysuria - Urinalysis, Complete  3. Tachycardia Could be related to dehydration  Avoid caffeine If continues may need to d/c adderall     4. Acute cystitis with hematuria Force fluids AZO over the counter X2 days RTO prn Culture pending - Urine Culture -  nitrofurantoin, macrocrystal-monohydrate, (MACROBID) 100 MG capsule; Take 1 capsule (100 mg total) by mouth 2 (two) times daily.  Dispense: 10 capsule; Refill: 0     Jannifer Rodney, FNP

## 2018-01-22 ENCOUNTER — Other Ambulatory Visit: Payer: Self-pay | Admitting: Nurse Practitioner

## 2018-01-23 ENCOUNTER — Telehealth: Payer: Self-pay | Admitting: Nurse Practitioner

## 2018-01-23 LAB — URINE CULTURE

## 2018-01-23 NOTE — Telephone Encounter (Signed)
Spoke with mother and she stated that her college stated that she needs a letter stating that she is taking a time released medication and needs to have her car parked near her class so she can get to her medications quickly. Letter written and left up front for patient pick up

## 2018-03-15 ENCOUNTER — Other Ambulatory Visit: Payer: Self-pay | Admitting: Nurse Practitioner

## 2018-04-14 ENCOUNTER — Encounter: Payer: Self-pay | Admitting: Family Medicine

## 2018-04-14 ENCOUNTER — Ambulatory Visit (INDEPENDENT_AMBULATORY_CARE_PROVIDER_SITE_OTHER): Payer: BC Managed Care – PPO | Admitting: Family Medicine

## 2018-04-14 ENCOUNTER — Other Ambulatory Visit: Payer: Self-pay

## 2018-04-14 DIAGNOSIS — B373 Candidiasis of vulva and vagina: Secondary | ICD-10-CM

## 2018-04-14 DIAGNOSIS — B3731 Acute candidiasis of vulva and vagina: Secondary | ICD-10-CM

## 2018-04-14 DIAGNOSIS — J309 Allergic rhinitis, unspecified: Secondary | ICD-10-CM

## 2018-04-14 MED ORDER — MICONAZOLE NITRATE 200 MG VA SUPP: 200.0000 mg | Freq: Every day | VAGINAL | 0 refills | Status: DC

## 2018-04-14 NOTE — Progress Notes (Signed)
Virtual Visit via telephone Note  I connected with Ruth Petty on 04/14/18 at 1105 by telephone and verified that I am speaking with the correct person using two identifiers. Ruth Petty is currently located at home and mother are currently with her during visit. The provider, Elige Radon Briant Angelillo, MD is located in their office at time of visit.  Call ended at 1119  I discussed the limitations, risks, security and privacy concerns of performing an evaluation and management service by telephone and the availability of in person appointments. I also discussed with the patient that there may be a patient responsible charge related to this service. The patient expressed understanding and agreed to proceed.   History and Present Illness: Patient started having sore throat and then developed congestion and eyes watering.  Sore throat is resolved.  She denies fevers or shortness of breath or wheezing.  She did try the flonase once and did not help.  Also took one mucinex and did not help.  This all started 2 days ago.  She lives at [facility].  No coronavirus contacts that she knows of.  Her roommate works at [employer].   Patient also has vaginal irritation that has just started 2 days.  She denies any recent intercourse and has not been sexually active since November and we are currently in April.  She denies any vaginal discharge or dysuria or abdominal pain but just is started having a little vaginal irritation and burning.  She did just recently change her birth control a little over a month ago and thinks that may have affected some things.  No diagnosis found.  Outpatient Encounter Medications as of 04/14/2018  Medication Sig  . amphetamine-dextroamphetamine (ADDERALL XR) 30 MG 24 hr capsule Take  1 capsule at 7am and one at 1pm daily (Patient not taking: Reported on 01/21/2018)  . amphetamine-dextroamphetamine (ADDERALL XR) 30 MG 24 hr capsule Take  1 capsule at 7am and one at 1pm daily (Patient  not taking: Reported on 01/21/2018)  . amphetamine-dextroamphetamine (ADDERALL XR) 30 MG 24 hr capsule Take  1 capsule at 7am and one at 1pm daily  . brompheniramine-pseudoephedrine-DM 30-2-10 MG/5ML syrup TAKE 5 MLS BY MOUTH 4 (FOUR) TIMES DAILY AS NEEDED.  . busPIRone (BUSPAR) 15 MG tablet Take 1 tablet (15 mg total) by mouth 2 (two) times daily. Needs to be seen before next refill  . citalopram (CELEXA) 40 MG tablet Take 1 tablet (40 mg total) by mouth daily. (Needs to be seen before next refill)  . JUNEL 1.5/30 1.5-30 MG-MCG tablet Take 1 tablet by mouth daily.  . nitrofurantoin, macrocrystal-monohydrate, (MACROBID) 100 MG capsule Take 1 capsule (100 mg total) by mouth 2 (two) times daily.  Marland Kitchen spironolactone (ALDACTONE) 25 MG tablet Take 25 mg by mouth 2 (two) times daily.  . traZODone (DESYREL) 50 MG tablet TAKE 1/2 TO 1 TABLET (25-50 MG TOTAL) BY MOUTH AT BEDTIME AS NEEDED FOR SLEEP.  Marland Kitchen tretinoin (RETIN-A) 0.025 % cream APPLY TO ACNE DAILY   No facility-administered encounter medications on file as of 04/14/2018.     Review of Systems  Constitutional: Negative for chills and fever.  HENT: Positive for congestion, postnasal drip, rhinorrhea, sneezing and sore throat. Negative for ear discharge, ear pain and sinus pressure.   Respiratory: Positive for cough. Negative for chest tightness and shortness of breath.   Cardiovascular: Negative for chest pain and leg swelling.  Gastrointestinal: Negative for abdominal pain.  Genitourinary: Positive for vaginal pain. Negative for  difficulty urinating, dysuria, flank pain, frequency, genital sores, menstrual problem, vaginal bleeding and vaginal discharge.  Musculoskeletal: Negative for back pain and gait problem.  Skin: Negative for rash.  Neurological: Negative for light-headedness and headaches.  Psychiatric/Behavioral: Negative for agitation and behavioral problems.  All other systems reviewed and are negative.   Observations/Objective:  Patient sounds comfortable and in no acute distress  Assessment and Plan: Problem List Items Addressed This Visit    None    Visit Diagnoses    Allergic rhinitis, unspecified seasonality, unspecified trigger    -  Primary   Yeast vaginitis       Relevant Medications   miconazole (MICONAZOLE 3) 200 MG vaginal suppository       Follow Up Instructions:  Sounds like allergic rhinitis but just in case recommended that she isolate herself from her parents who are elderly  Recommended to take Claritin, also sent in yeast cream   I discussed the assessment and treatment plan with the patient. The patient was provided an opportunity to ask questions and all were answered. The patient agreed with the plan and demonstrated an understanding of the instructions.   The patient was advised to call back or seek an in-person evaluation if the symptoms worsen or if the condition fails to improve as anticipated.  The above assessment and management plan was discussed with the patient. The patient verbalized understanding of and has agreed to the management plan. Patient is aware to call the clinic if symptoms persist or worsen. Patient is aware when to return to the clinic for a follow-up visit. Patient educated on when it is appropriate to go to the emergency department.    I provided 14 minutes of non-face-to-face time during this encounter.    Nils Pyle, MD

## 2018-04-16 ENCOUNTER — Telehealth: Payer: Self-pay | Admitting: Nurse Practitioner

## 2018-04-16 DIAGNOSIS — J0100 Acute maxillary sinusitis, unspecified: Secondary | ICD-10-CM

## 2018-04-16 MED ORDER — AZITHROMYCIN 250 MG PO TABS: ORAL_TABLET | ORAL | 0 refills | Status: DC

## 2018-04-16 NOTE — Telephone Encounter (Signed)
Spoke with patient and her mother and sounds like her mother started getting sick and was sent in a Z-Pak so they are wondering if they can get one for her, it sounds like mostly her symptoms are starting to resolve and recommended not to take it unless things worsen but they can hold onto it for now. Arville Care, MD [facility] 04/16/2018, 11:24 AM

## 2018-04-16 NOTE — Telephone Encounter (Signed)
[employer] aware.  

## 2018-04-16 NOTE — Telephone Encounter (Signed)
J01.00 acute maxillary sinusitis

## 2018-04-16 NOTE — Telephone Encounter (Signed)
Please review and advise.

## 2018-04-16 NOTE — Telephone Encounter (Signed)
Pt mom has called states that she believes that the pt does need something for a cough and would like to speak to Dr Dettinger about it. PT did a telephone visit on Monday with Dr Louanne Skye

## 2018-04-16 NOTE — Telephone Encounter (Signed)
Please review

## 2018-04-22 ENCOUNTER — Ambulatory Visit (INDEPENDENT_AMBULATORY_CARE_PROVIDER_SITE_OTHER): Payer: BC Managed Care – PPO | Admitting: Family

## 2018-04-22 ENCOUNTER — Other Ambulatory Visit: Payer: Self-pay

## 2018-04-22 ENCOUNTER — Encounter: Payer: Self-pay | Admitting: Family

## 2018-04-22 DIAGNOSIS — J029 Acute pharyngitis, unspecified: Secondary | ICD-10-CM | POA: Diagnosis not present

## 2018-04-22 MED ORDER — AMOXICILLIN 875 MG PO TABS: 875.0000 mg | ORAL_TABLET | Freq: Two times a day (BID) | ORAL | 0 refills | Status: DC

## 2018-04-22 NOTE — Progress Notes (Signed)
   Virtual Visit via telephone Note  I connected with Ruth Petty on 04/22/18 at 3:50 pm by telephone and verified that I am speaking with the correct person using two identifiers. Ruth Petty is currently located at home and mom is currently with her during visit. The provider, Jannifer Rodney, FNP is located in their office at time of visit.  I discussed the limitations, risks, security and privacy concerns of performing an evaluation and management service by telephone and the availability of in person appointments. I also discussed with the patient that there may be a patient responsible charge related to this service. The patient expressed understanding and agreed to proceed.   History and Present Illness:  PT calls in today with recurrent sore throat. She called the office on 04/14/18 with cough and a Zpak was sent in but told to only take if her symptoms worsen. She started taking mucinex and Claritin every day and her symptoms improved. However, states her sore throat returned yesterday.  Sore Throat   This is a recurrent problem. The current episode started 1 to 4 weeks ago. The problem has been waxing and waning. There has been no fever. The pain is at a severity of 4/10. The pain is mild. Associated symptoms include congestion ("a little"), coughing, ear pain, neck pain and swollen glands (right side). Pertinent negatives include no ear discharge, headaches, shortness of breath or trouble swallowing. She has tried acetaminophen for the symptoms. The treatment provided mild relief.      Review of Systems  HENT: Positive for congestion ("a little") and ear pain. Negative for ear discharge and trouble swallowing.   Respiratory: Positive for cough. Negative for shortness of breath.   Musculoskeletal: Positive for neck pain.  Neurological: Negative for headaches.    Observations/Objective: No SOB or distress noted  Assessment and Plan: 1. Acute pharyngitis, unspecified  etiology - Take meds as prescribed - Use a cool mist humidifier  -Use saline nose sprays frequently -Force fluids -For any cough or congestion  Use plain Mucinex- regular strength or max strength is fine -For fever or aces or pains- take tylenol or ibuprofen. -Throat lozenges if help -New toothbrush in 3 days RTO if symptoms worsen or do not improve  - amoxicillin (AMOXIL) 875 MG tablet; Take 1 tablet (875 mg total) by mouth 2 (two) times daily.  Dispense: 14 tablet; Refill: 0     I discussed the assessment and treatment plan with the patient. The patient was provided an opportunity to ask questions and all were answered. The patient agreed with the plan and demonstrated an understanding of the instructions.   The patient was advised to call back or seek an in-person evaluation if the symptoms worsen or if the condition fails to improve as anticipated.  The above assessment and management plan was discussed with the patient. The patient verbalized understanding of and has agreed to the management plan. Patient is aware to call the clinic if symptoms persist or worsen. Patient is aware when to return to the clinic for a follow-up visit. Patient educated on when it is appropriate to go to the emergency department.    Call ended 3:58 pm, I provided 8  minutes of non-face-to-face time during this encounter.    Jannifer Rodney, FNP

## 2018-05-01 ENCOUNTER — Other Ambulatory Visit: Payer: Self-pay | Admitting: Nurse Practitioner

## 2018-05-02 ENCOUNTER — Other Ambulatory Visit: Payer: Self-pay | Admitting: *Deleted

## 2018-05-02 NOTE — Telephone Encounter (Signed)
MMM. NTBS 30 days given 03/18/18

## 2018-05-02 NOTE — Telephone Encounter (Signed)
appt made for MON with MMM

## 2018-05-05 ENCOUNTER — Ambulatory Visit: Payer: Commercial Managed Care - PPO | Admitting: Nurse Practitioner

## 2018-06-09 ENCOUNTER — Other Ambulatory Visit: Payer: Self-pay

## 2018-06-09 ENCOUNTER — Ambulatory Visit (INDEPENDENT_AMBULATORY_CARE_PROVIDER_SITE_OTHER): Payer: BC Managed Care – PPO | Admitting: Nurse Practitioner

## 2018-06-09 ENCOUNTER — Encounter: Payer: Self-pay | Admitting: Nurse Practitioner

## 2018-06-09 DIAGNOSIS — F411 Generalized anxiety disorder: Secondary | ICD-10-CM

## 2018-06-09 DIAGNOSIS — F902 Attention-deficit hyperactivity disorder, combined type: Secondary | ICD-10-CM | POA: Diagnosis not present

## 2018-06-09 MED ORDER — AMPHETAMINE-DEXTROAMPHET ER 30 MG PO CP24: ORAL_CAPSULE | ORAL | 0 refills | Status: DC

## 2018-06-09 MED ORDER — CITALOPRAM HYDROBROMIDE 40 MG PO TABS: 40.0000 mg | ORAL_TABLET | Freq: Every day | ORAL | 1 refills | Status: DC

## 2018-06-09 NOTE — Progress Notes (Signed)
Patient ID: Ruth Petty, female   DOB: 10-22-1995, 23 y.o.   MRN: [id number]   Virtual Visit via telephone Note  I connected with Ruth Petty on 06/09/18 at 3:50 PM by telephone and verified that I am speaking with the correct person using two identifiers. Ruth Petty is currently located at home and mom is with her is currently with her during visit. The provider, Mary-Margaret Daphine Deutscher, FNP is located in their office at time of visit.  I discussed the limitations, risks, security and privacy concerns of performing an evaluation and management service by telephone and the availability of in person appointments. I also discussed with the patient that there may be a patient responsible charge related to this service. The patient expressed understanding and agreed to proceed.   History and Present Illness:   Chief Complaint: ADHD   HPI Patient calls in today for follow up of ADHD. She is currently  on adderall 30mg  BID. She did not take over spring break but that did not go well. She is back on it BID. She is doing on line classes for college currently. Grades last semester were good. She is also on celexa daily to help keep her calm.   Review of Systems  Constitutional: Negative for diaphoresis and weight loss.  Eyes: Negative for blurred vision, double vision and pain.  Respiratory: Negative for shortness of breath.   Cardiovascular: Negative for chest pain, palpitations, orthopnea and leg swelling.  Gastrointestinal: Negative for abdominal pain.  Skin: Negative for rash.  Neurological: Negative for dizziness, sensory change, loss of consciousness, weakness and headaches.  Endo/Heme/Allergies: Negative for polydipsia. Does not bruise/bleed easily.  Psychiatric/Behavioral: Negative for memory loss. The patient does not have insomnia.   All other systems reviewed and are negative.    Observations/Objective: Alert and oriented Answers all questions appropriately.   Assessment and Plan: Ruth Petty in today with chief complaint of ADHD   1. Attention deficit hyperactivity disorder (ADHD), combined type Stress management - amphetamine-dextroamphetamine (ADDERALL XR) 30 MG 24 hr capsule; Take  1 capsule at 7am and one at 1pm daily  Dispense: 60 capsule; Refill: 0 - amphetamine-dextroamphetamine (ADDERALL XR) 30 MG 24 hr capsule; Take  1 capsule at 7am and one at 1pm daily  Dispense: 60 capsule; Refill: 0 - amphetamine-dextroamphetamine (ADDERALL XR) 30 MG 24 hr capsule; Take  1 capsule at 7am and one at 1pm daily  Dispense: 60 capsule; Refill: 0  2. Generalized anxiety disorder - citalopram (CELEXA) 40 MG tablet; Take 1 tablet (40 mg total) by mouth daily. (Needs to be seen before next refill)  Dispense: 90 tablet; Refill: 1   Follow Up Instructions: 3 months    I discussed the assessment and treatment plan with the patient. The patient was provided an opportunity to ask questions and all were answered. The patient agreed with the plan and demonstrated an understanding of the instructions.   The patient was advised to call back or seek an in-person evaluation if the symptoms worsen or if the condition fails to improve as anticipated.  The above assessment and management plan was discussed with the patient. The patient verbalized understanding of and has agreed to the management plan. Patient is aware to call the clinic if symptoms persist or worsen. Patient is aware when to return to the clinic for a follow-up visit. Patient educated on when it is appropriate to go to the emergency department.   Time call ended:  4:05 PM  I  provided 15  minutes of non-face-to-face time during this encounter.    Mary-Margaret Daphine DeutscherMartin, FNP

## 2018-09-09 ENCOUNTER — Ambulatory Visit: Payer: Self-pay | Admitting: Nurse Practitioner

## 2018-09-17 ENCOUNTER — Other Ambulatory Visit: Payer: Self-pay | Admitting: Nurse Practitioner

## 2018-09-22 ENCOUNTER — Encounter: Payer: Self-pay | Admitting: Nurse Practitioner

## 2018-09-22 ENCOUNTER — Ambulatory Visit (INDEPENDENT_AMBULATORY_CARE_PROVIDER_SITE_OTHER): Payer: Commercial Managed Care - PPO | Admitting: Nurse Practitioner

## 2018-09-22 ENCOUNTER — Other Ambulatory Visit: Payer: Self-pay

## 2018-09-22 DIAGNOSIS — F902 Attention-deficit hyperactivity disorder, combined type: Secondary | ICD-10-CM

## 2018-09-22 DIAGNOSIS — F5104 Psychophysiologic insomnia: Secondary | ICD-10-CM

## 2018-09-22 DIAGNOSIS — F411 Generalized anxiety disorder: Secondary | ICD-10-CM

## 2018-09-22 DIAGNOSIS — F331 Major depressive disorder, recurrent, moderate: Secondary | ICD-10-CM

## 2018-09-22 MED ORDER — AMPHETAMINE-DEXTROAMPHET ER 30 MG PO CP24: ORAL_CAPSULE | ORAL | 0 refills | Status: DC

## 2018-09-22 MED ORDER — BUSPIRONE HCL 15 MG PO TABS: ORAL_TABLET | ORAL | 1 refills | Status: DC

## 2018-09-22 MED ORDER — CITALOPRAM HYDROBROMIDE 40 MG PO TABS: 40.0000 mg | ORAL_TABLET | Freq: Every day | ORAL | 1 refills | Status: DC

## 2018-09-22 MED ORDER — TRAZODONE HCL 50 MG PO TABS: ORAL_TABLET | ORAL | 1 refills | Status: DC

## 2018-09-22 NOTE — Progress Notes (Signed)
Virtual Visit via telephone Note Due to COVID-19 pandemic this visit was conducted virtually. This visit type was conducted due to national recommendations for restrictions regarding the COVID-19 Pandemic (e.g. social distancing, sheltering in place) in an effort to limit this patient's exposure and mitigate transmission in our community. All issues noted in this document were discussed and addressed.  A physical exam was not performed with this format.  I connected with Ruth Petty on 09/22/18 at 8:55 by telephone and verified that I am speaking with the correct person using two identifiers. Ruth Petty is currently located at *home and her mom is currently with her during visit. The provider, Mary-Margaret Hassell Done, FNP is located in their office at time of visit.  I discussed the limitations, risks, security and privacy concerns of performing an evaluation and management service by telephone and the availability of in person appointments. I also discussed with the patient that there may be a patient responsible charge related to this service. The patient expressed understanding and agreed to proceed.   History and Present Illness:   Chief Complaint: ADHD    HPI:  1. Attention deficit hyperactivity disorder (ADHD), combined type Has been on adderall 30mg  daily. 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 She hs no side effects from adderall  2. Moderate episode of recurrent major depressive disorder ([place]) She is currently going through a break up with her boyfriend. She denies any worsening depression. She is on celexa and buspar and combination works well for her.   3. Generalized anxiety disorder Again is on busar as needed. Usually takes at least 1x a day  4. insomnia This is due to the adderall that she takes trazadone for.   Outpatient Encounter Medications as of 09/22/2018  Medication Sig  .  amphetamine-dextroamphetamine (ADDERALL XR) 30 MG 24 hr capsule Take  1 capsule at 7am and one at 1pm daily  . amphetamine-dextroamphetamine (ADDERALL XR) 30 MG 24 hr capsule Take  1 capsule at 7am and one at 1pm daily  . amphetamine-dextroamphetamine (ADDERALL XR) 30 MG 24 hr capsule Take  1 capsule at 7am and one at 1pm daily  . busPIRone (BUSPAR) 15 MG tablet TAKE 1 TABLET (15 MG TOTAL) BY MOUTH 2 (TWO) TIMES DAILY.  . citalopram (CELEXA) 40 MG tablet Take 1 tablet (40 mg total) by mouth daily. (Needs to be seen before next refill)  . JUNEL 1.5/30 1.5-30 MG-MCG tablet Take 1 tablet by mouth daily.  . miconazole (MICONAZOLE 3) 200 MG vaginal suppository Place 1 suppository (200 mg total) vaginally at bedtime.  . traZODone (DESYREL) 50 MG tablet TAKE 1/2 TO 1 TABLET (25-50 MG TOTAL) BY MOUTH AT BEDTIME AS NEEDED FOR SLEEP.  Marland Kitchen tretinoin (RETIN-A) 0.025 % cream APPLY TO ACNE DAILY     No past surgical history on file.  No family history on file.  New complaints: None today  Social history: Lives in [place] in apartment during the week and comes home for the weekends.  Controlled substance contract: will have signed at next face to face visit.    Review of Systems  Constitutional: Negative for diaphoresis and weight loss.  Eyes: Negative for blurred vision, double vision and pain.  Respiratory: Negative for shortness of breath.   Cardiovascular: Negative for chest pain, palpitations, orthopnea and leg swelling.  Gastrointestinal: Negative for abdominal pain.  Skin: Negative for rash.  Neurological: Negative for dizziness, sensory change, loss of  consciousness, weakness and headaches.  Endo/Heme/Allergies: Negative for polydipsia. Does not bruise/bleed easily.  Psychiatric/Behavioral: Negative for memory loss. The patient does not have insomnia.   All other systems reviewed and are negative.    Observations/Objective: Alert and oriented- answers all questions appropriately  No distress    Assessment and Plan: Ruth Petty in today with chief complaint of ADHD   1. Attention deficit hyperactivity disorder (ADHD), combined type Stay focused - amphetamine-dextroamphetamine (ADDERALL XR) 30 MG 24 hr capsule; Take  1 capsule at 7am and one at 1pm daily  Dispense: 60 capsule; Refill: 0 - amphetamine-dextroamphetamine (ADDERALL XR) 30 MG 24 hr capsule; Take  1 capsule at 7am and one at 1pm daily  Dispense: 60 capsule; Refill: 0 - amphetamine-dextroamphetamine (ADDERALL XR) 30 MG 24 hr capsule; Take  1 capsule at 7am and one at 1pm daily  Dispense: 60 capsule; Refill: 0  2. Moderate episode of recurrent major depressive disorder ([facility]) Stress management  3. Generalized anxiety disorder Again stres management skills - citalopram (CELEXA) 40 MG tablet; Take 1 tablet (40 mg total) by mouth daily. (Needs to be seen before next refill)  Dispense: 90 tablet; Refill: 1 - traZODone (DESYREL) 50 MG tablet; TAKE 1/2 TO 1 TABLET (25-50 MG TOTAL) BY MOUTH AT BEDTIME AS NEEDED FOR SLEEP.  Dispense: 90 tablet; Refill: 1  4. Psychophysiological insomnia Bedtime routine - traZODone (DESYREL) 50 MG tablet; TAKE 1/2 TO 1 TABLET (25-50 MG TOTAL) BY MOUTH AT BEDTIME AS NEEDED FOR SLEEP.  Dispense: 90 tablet; Refill: 1   Follow Up Instructions: prn    I discussed the assessment and treatment plan with the patient. The patient was provided an opportunity to ask questions and all were answered. The patient agreed with the plan and demonstrated an understanding of the instructions.   The patient was advised to call back or seek an in-person evaluation if the symptoms worsen or if the condition fails to improve as anticipated.  The above assessment and management plan was discussed with the patient. The patient verbalized understanding of and has agreed to the management plan. Patient is aware to call the clinic if symptoms persist or worsen. Patient is aware when to return to  the clinic for a follow-up visit. Patient educated on when it is appropriate to go to the emergency department.   Time call ended:  9:11  I provided 16 minutes of non-face-to-face time during this encounter.    Mary-Margaret Daphine DeutscherMartin, FNP

## 2018-11-09 ENCOUNTER — Other Ambulatory Visit: Payer: Self-pay | Admitting: Nurse Practitioner

## 2018-12-01 ENCOUNTER — Other Ambulatory Visit: Payer: Self-pay | Admitting: Nurse Practitioner

## 2018-12-23 ENCOUNTER — Other Ambulatory Visit: Payer: Self-pay

## 2018-12-24 ENCOUNTER — Telehealth: Payer: Self-pay | Admitting: Nurse Practitioner

## 2018-12-24 ENCOUNTER — Ambulatory Visit (INDEPENDENT_AMBULATORY_CARE_PROVIDER_SITE_OTHER): Payer: Commercial Managed Care - PPO | Admitting: Family Medicine

## 2018-12-24 ENCOUNTER — Encounter: Payer: Self-pay | Admitting: Family Medicine

## 2018-12-24 VITALS — BP 115/85 | HR 114 | Temp 98.0°F | Resp 20 | Ht 67.0 in | Wt 144.0 lb

## 2018-12-24 DIAGNOSIS — N3090 Cystitis, unspecified without hematuria: Secondary | ICD-10-CM

## 2018-12-24 DIAGNOSIS — R300 Dysuria: Secondary | ICD-10-CM | POA: Diagnosis not present

## 2018-12-24 LAB — URINALYSIS, COMPLETE
Bilirubin, UA: NEGATIVE
Glucose, UA: NEGATIVE
Ketones, UA: NEGATIVE
Nitrite, UA: NEGATIVE
Protein,UA: NEGATIVE
Specific Gravity, UA: >1.03 — ABNORMAL HIGH (ref 1.005–1.030)
Urobilinogen, Ur: 0.2 mg/dL (ref 0.2–1.0)
pH, UA: 5.5 (ref 5.0–7.5)

## 2018-12-24 LAB — MICROSCOPIC EXAMINATION
Epithelial Cells (non renal): >10 /hpf — AB (ref 0–10)
Renal Epithel, UA: NONE SEEN /hpf

## 2018-12-24 MED ORDER — FLUCONAZOLE 150 MG PO TABS: 150.0000 mg | ORAL_TABLET | Freq: Once | ORAL | 0 refills | Status: AC

## 2018-12-24 MED ORDER — CIPROFLOXACIN HCL 500 MG PO TABS: 500.0000 mg | ORAL_TABLET | Freq: Two times a day (BID) | ORAL | 0 refills | Status: DC

## 2018-12-24 NOTE — Telephone Encounter (Signed)
Patient aware to take medications prescribed

## 2018-12-24 NOTE — Progress Notes (Signed)
Subjective:  Patient ID: Ruth HAGEY, female    DOB: 07-05-1995  Age: 23 y.o. MRN: [id number]  CC: Urinary Frequency (Took an AZO test yesterday and came back postiive)   HPI Ruth Petty presents for urgencywith urination and frequency for several weeks. Dysuria started today. No vaginal DC but has somitching Denies fever . No flank pain. No nausea, vomiting.   Depression screen [facility] 2/9 12/24/2018 09/22/2018 01/21/2018  Decreased Interest 0 1 1  Down, Depressed, Hopeless 0 1 0  PHQ - 2 Score 0 2 1  Altered sleeping - 0 -  Tired, decreased energy - 0 -  Change in appetite - 0 -  Feeling bad or failure about yourself  - 0 -  Trouble concentrating - 0 -  Moving slowly or fidgety/restless - 0 -  Suicidal thoughts - 0 -  PHQ-9 Score - 2 -  Difficult doing work/chores - Somewhat difficult -  Some recent data might be hidden    History Ruth Petty has a past medical history of ADHD (attention deficit hyperactivity disorder).   Ruth Petty has no past surgical history on file.   Her family history is not on file.Ruth Petty reports that Ruth Petty has never smoked. Ruth Petty has never used smokeless tobacco. Ruth Petty reports that Ruth Petty does not drink alcohol or use drugs.    ROS Review of Systems  Constitutional: Negative for chills, diaphoresis and fever.  HENT: Negative for congestion.   Eyes: Negative for visual disturbance.  Respiratory: Negative for cough and shortness of breath.   Cardiovascular: Negative for chest pain and palpitations.  Gastrointestinal: Negative for constipation, diarrhea and nausea.  Genitourinary: Positive for dysuria, frequency and urgency. Negative for decreased urine volume, flank pain, hematuria, menstrual problem and pelvic pain.  Musculoskeletal: Negative for arthralgias and joint swelling.  Skin: Negative for rash.  Neurological: Negative for dizziness and numbness.    Objective:  BP 115/85   Pulse (!) 114   Temp 98 F (36.7 C) (Temporal)   Resp 20   Ht 5\' 7"   (1.702 m)   Wt 144 lb (65.3 kg)   SpO2 98%   BMI 22.55 kg/m   BP Readings from Last 3 Encounters:  12/24/18 115/85  01/21/18 126/81  12/20/17 106/75    Wt Readings from Last 3 Encounters:  12/24/18 144 lb (65.3 kg)  01/21/18 148 lb 3.2 oz (67.2 kg)  12/20/17 149 lb (67.6 kg)     Physical Exam Constitutional:      Appearance: Ruth Petty is well-developed.  HENT:     Head: Normocephalic and atraumatic.  Cardiovascular:     Rate and Rhythm: Normal rate and regular rhythm.     Heart sounds: No murmur.  Pulmonary:     Effort: Pulmonary effort is normal.     Breath sounds: Normal breath sounds.  Abdominal:     General: Bowel sounds are normal.     Palpations: Abdomen is soft. There is no mass.     Tenderness: There is no abdominal tenderness. There is no guarding or rebound.  Musculoskeletal:        General: No tenderness.  Skin:    General: Skin is warm and dry.  Neurological:     Mental Status: Ruth Petty is alert and oriented to person, place, and time.  Psychiatric:        Behavior: Behavior normal.       Assessment & Plan:   Ruth Petty was seen today for urinary frequency.  Diagnoses and all orders for this visit:  Cystitis  Dysuria -     urinalysis- dip and micro -     Urine culture  Other orders -     fluconazole (DIFLUCAN) 150 MG tablet; Take 1 tablet (150 mg total) by mouth once for 1 dose. At onset of symptoms. Repeat at end of treatment -     ciprofloxacin (CIPRO) 500 MG tablet; Take 1 tablet (500 mg total) by mouth 2 (two) times daily.       I have discontinued Ruth Petty's miconazole. I am also having her start on fluconazole and ciprofloxacin. Additionally, I am having her maintain her Junel 1.5/30, tretinoin, amphetamine-dextroamphetamine, amphetamine-dextroamphetamine, amphetamine-dextroamphetamine, citalopram, traZODone, and busPIRone.  Allergies as of 12/24/2018   No Known Allergies     Medication List       Accurate as of December 24, 2018  9:19 AM. If you have any questions, ask your nurse or doctor.        STOP taking these medications   miconazole 200 MG vaginal suppository Commonly known as: Miconazole 3 Stopped by: Mechele Claude, MD     TAKE these medications   amphetamine-dextroamphetamine 30 MG 24 hr capsule Commonly known as: Adderall XR Take  1 capsule at 7am and one at 1pm daily   amphetamine-dextroamphetamine 30 MG 24 hr capsule Commonly known as: Adderall XR Take  1 capsule at 7am and one at 1pm daily   amphetamine-dextroamphetamine 30 MG 24 hr capsule Commonly known as: Adderall XR Take  1 capsule at 7am and one at 1pm daily   busPIRone 15 MG tablet Commonly known as: BUSPAR TAKE 1 TABLET (15 MG TOTAL) BY MOUTH 2 (TWO) TIMES DAILY.   ciprofloxacin 500 MG tablet Commonly known as: Cipro Take 1 tablet (500 mg total) by mouth 2 (two) times daily. Started by: Mechele Claude, MD   citalopram 40 MG tablet Commonly known as: CELEXA Take 1 tablet (40 mg total) by mouth daily. (Needs to be seen before next refill)   fluconazole 150 MG tablet Commonly known as: DIFLUCAN Take 1 tablet (150 mg total) by mouth once for 1 dose. At onset of symptoms. Repeat at end of treatment Started by: Mechele Claude, MD   Colleen Can 1.5/30 1.5-30 MG-MCG tablet Generic drug: Norethindrone Acetate-Ethinyl Estradiol Take 1 tablet by mouth daily.   traZODone 50 MG tablet Commonly known as: DESYREL TAKE 1/2 TO 1 TABLET (25-50 MG TOTAL) BY MOUTH AT BEDTIME AS NEEDED FOR SLEEP.   tretinoin 0.025 % cream Commonly known as: RETIN-A APPLY TO ACNE DAILY        Follow-up: No follow-ups on file.  Mechele Claude, M.D.

## 2018-12-25 LAB — URINE CULTURE

## 2019-01-14 ENCOUNTER — Ambulatory Visit (INDEPENDENT_AMBULATORY_CARE_PROVIDER_SITE_OTHER): Payer: Commercial Managed Care - PPO | Admitting: Family Medicine

## 2019-01-14 DIAGNOSIS — R0989 Other specified symptoms and signs involving the circulatory and respiratory systems: Secondary | ICD-10-CM | POA: Diagnosis not present

## 2019-01-14 DIAGNOSIS — F410 Panic disorder [episodic paroxysmal anxiety] without agoraphobia: Secondary | ICD-10-CM | POA: Diagnosis not present

## 2019-01-14 MED ORDER — ALBUTEROL SULFATE HFA 108 (90 BASE) MCG/ACT IN AERS: 2.0000 | INHALATION_SPRAY | Freq: Four times a day (QID) | RESPIRATORY_TRACT | 0 refills | Status: DC | PRN

## 2019-01-14 MED ORDER — HYDROXYZINE PAMOATE 25 MG PO CAPS: 25.0000 mg | ORAL_CAPSULE | Freq: Three times a day (TID) | ORAL | 0 refills | Status: DC | PRN

## 2019-01-14 NOTE — Progress Notes (Signed)
Virtual Visit via Telephone Note  I connected with Ruth Petty on 01/18/19 at 2:20 PM by telephone and verified that I am speaking with the correct person using two identifiers. Ruth Petty is currently located at home and nobody is currently with her during this visit. The provider, Loman Brooklyn, FNP is located in their office at time of visit.  I discussed the limitations, risks, security and privacy concerns of performing an evaluation and management service by telephone and the availability of in person appointments. I also discussed with the patient that there may be a patient responsible charge related to this service. The patient expressed understanding and agreed to proceed.  Subjective: PCP: Ruth Pretty, FNP  Chief Complaint  Patient presents with  . Other    feels like someone is chocking her at night   Patient reports a sensation of which she feels like someone is choking her.  It mostly occurs at night but does occasionally occur during the day.  When this is happening she finds herself taking deep breaths.  It typically lasts about 30 minutes and then she is able to fall asleep, but she is also taking half of a sleeping pill when it starts.  Sometimes she reports occasional trembling, dizziness, and heart racing when this is occurring.  Patient does endorse a history of panic attacks but states this does not feel like that.  All of this started after she had COVID-19.   ROS: Per HPI  Current Outpatient Medications:  .  albuterol (VENTOLIN HFA) 108 (90 Base) MCG/ACT inhaler, Inhale 2 puffs into the lungs every 6 (six) hours as needed for wheezing or shortness of breath., Disp: 18 g, Rfl: 0 .  amphetamine-dextroamphetamine (ADDERALL XR) 30 MG 24 hr capsule, Take  1 capsule at 7am and one at 1pm daily, Disp: 60 capsule, Rfl: 0 .  amphetamine-dextroamphetamine (ADDERALL XR) 30 MG 24 hr capsule, Take  1 capsule at 7am and one at 1pm daily, Disp: 60 capsule,  Rfl: 0 .  amphetamine-dextroamphetamine (ADDERALL XR) 30 MG 24 hr capsule, Take  1 capsule at 7am and one at 1pm daily, Disp: 60 capsule, Rfl: 0 .  busPIRone (BUSPAR) 15 MG tablet, TAKE 1 TABLET (15 MG TOTAL) BY MOUTH 2 (TWO) TIMES DAILY., Disp: 180 tablet, Rfl: 0 .  citalopram (CELEXA) 40 MG tablet, Take 1 tablet (40 mg total) by mouth daily. (Needs to be seen before next refill), Disp: 90 tablet, Rfl: 1 .  hydrOXYzine (VISTARIL) 25 MG capsule, Take 1 capsule (25 mg total) by mouth 3 (three) times daily as needed for anxiety., Disp: 30 capsule, Rfl: 0 .  JUNEL 1.5/30 1.5-30 MG-MCG tablet, Take 1 tablet by mouth daily., Disp: , Rfl: 4 .  traZODone (DESYREL) 50 MG tablet, TAKE 1/2 TO 1 TABLET (25-50 MG TOTAL) BY MOUTH AT BEDTIME AS NEEDED FOR SLEEP., Disp: 90 tablet, Rfl: 1 .  tretinoin (RETIN-A) 0.025 % cream, APPLY TO ACNE DAILY, Disp: , Rfl: 6  No Known Allergies Past Medical History:  Diagnosis Date  . ADHD (attention deficit hyperactivity disorder)     Observations/Objective: A&O  No respiratory distress or wheezing audible over the phone Mood, judgement, and thought processes all WNL  Assessment and Plan: 1-2. Choking sensation/Panic - Encouraged patient to try using albuterol inhaler when she feels this choking sensation.  It sounds like she is having some tightness which is causing her than to panic.  Advised she may also take hydroxyzine if needed for  the panic. - albuterol (VENTOLIN HFA) 108 (90 Base) MCG/ACT inhaler; Inhale 2 puffs into the lungs every 6 (six) hours as needed for wheezing or shortness of breath.  Dispense: 18 g; Refill: 0 - hydrOXYzine (VISTARIL) 25 MG capsule; Take 1 capsule (25 mg total) by mouth 3 (three) times daily as needed for anxiety.  Dispense: 30 capsule; Refill: 0   Follow Up Instructions:  I discussed the assessment and treatment plan with the patient. The patient was provided an opportunity to ask questions and all were answered. The patient agreed  with the plan and demonstrated an understanding of the instructions.   The patient was advised to call back or seek an in-person evaluation if the symptoms worsen or if the condition fails to improve as anticipated.  The above assessment and management plan was discussed with the patient. The patient verbalized understanding of and has agreed to the management plan. Patient is aware to call the clinic if symptoms persist or worsen. Patient is aware when to return to the clinic for a follow-up visit. Patient educated on when it is appropriate to go to the emergency department.   Time call ended: 2:31 PM  I provided 13 minutes of non-face-to-face time during this encounter.  Deliah Boston, MSN, APRN, FNP-C [facility] 01/18/19

## 2019-01-18 ENCOUNTER — Encounter: Payer: Self-pay | Admitting: Family Medicine

## 2019-02-01 ENCOUNTER — Other Ambulatory Visit: Payer: Self-pay | Admitting: Family Medicine

## 2019-02-01 DIAGNOSIS — F410 Panic disorder [episodic paroxysmal anxiety] without agoraphobia: Secondary | ICD-10-CM

## 2019-02-01 DIAGNOSIS — R0989 Other specified symptoms and signs involving the circulatory and respiratory systems: Secondary | ICD-10-CM

## 2019-03-07 ENCOUNTER — Other Ambulatory Visit: Payer: Self-pay | Admitting: Nurse Practitioner

## 2019-03-19 ENCOUNTER — Other Ambulatory Visit: Payer: Self-pay | Admitting: Nurse Practitioner

## 2019-03-19 DIAGNOSIS — F411 Generalized anxiety disorder: Secondary | ICD-10-CM

## 2019-03-19 DIAGNOSIS — F5104 Psychophysiologic insomnia: Secondary | ICD-10-CM

## 2019-04-01 ENCOUNTER — Other Ambulatory Visit: Payer: Self-pay | Admitting: Nurse Practitioner

## 2019-04-01 DIAGNOSIS — F410 Panic disorder [episodic paroxysmal anxiety] without agoraphobia: Secondary | ICD-10-CM

## 2019-04-01 DIAGNOSIS — R0989 Other specified symptoms and signs involving the circulatory and respiratory systems: Secondary | ICD-10-CM

## 2019-04-16 ENCOUNTER — Other Ambulatory Visit: Payer: Self-pay | Admitting: Nurse Practitioner

## 2019-04-16 DIAGNOSIS — F411 Generalized anxiety disorder: Secondary | ICD-10-CM

## 2019-04-20 ENCOUNTER — Telehealth: Payer: Self-pay | Admitting: Nurse Practitioner

## 2019-04-20 NOTE — Telephone Encounter (Signed)
  Prescription Request  04/20/2019  What is the name of the medication or equipment? Adderall  Have you contacted your pharmacy to request a refill? (if applicable) Yes  Which pharmacy would you like this sent to? [facility]   Patient notified that their request is being sent to the clinical staff for review and that they should receive a response within 2 business days.   Been out since last [facility] call mom today.  MMM's pt.  Please call, if she ntbs.

## 2019-04-20 NOTE — Telephone Encounter (Signed)
Video visit set for 04/21/19

## 2019-04-21 ENCOUNTER — Encounter: Payer: Self-pay | Admitting: Nurse Practitioner

## 2019-04-21 ENCOUNTER — Telehealth (INDEPENDENT_AMBULATORY_CARE_PROVIDER_SITE_OTHER): Payer: Commercial Managed Care - PPO | Admitting: Nurse Practitioner

## 2019-04-21 DIAGNOSIS — F331 Major depressive disorder, recurrent, moderate: Secondary | ICD-10-CM

## 2019-04-21 DIAGNOSIS — F902 Attention-deficit hyperactivity disorder, combined type: Secondary | ICD-10-CM

## 2019-04-21 DIAGNOSIS — F411 Generalized anxiety disorder: Secondary | ICD-10-CM

## 2019-04-21 MED ORDER — AMPHETAMINE-DEXTROAMPHET ER 30 MG PO CP24: 30.0000 mg | ORAL_CAPSULE | ORAL | 0 refills | Status: DC

## 2019-04-21 MED ORDER — ESCITALOPRAM OXALATE 20 MG PO TABS: 20.0000 mg | ORAL_TABLET | Freq: Every day | ORAL | 5 refills | Status: DC

## 2019-04-21 NOTE — Progress Notes (Signed)
Virtual Visit via video Note   Due to COVID-19 pandemic this visit was conducted virtually. This visit type was conducted due to national recommendations for restrictions regarding the COVID-19 Pandemic (e.g. social distancing, sheltering in place) in an effort to limit this patient's exposure and mitigate transmission in our community. All issues noted in this document were discussed and addressed.  A physical exam was not performed with this format. 1 I connected with  [name]  on 04/21/19 at 10:50 by video and verified that I am speaking with the correct person using two identifiers. Ruth Petty is currently located at home and her mom is currently with her during visit. The provider, Mary-Margaret Hassell Done, FNP is located in their office at time of visit.  I discussed the limitations, risks, security and privacy concerns of performing an evaluation and management service by telephone and the availability of in person appointments. I also discussed with the patient that there may be a patient responsible charge related to this service. The patient expressed understanding and agreed to proceed.   History and Present Illness:  Ruth Petty comes in today with chief complaint of No chief complaint on file.   Diagnosis and orders addressed:  1. Attention deficit hyperactivity disorder (ADHD), combined type patient has had issues with her attention span since hight school. She is now in college and cannot stay focused without medication.she is currently on adderall XR 30mg  daily.  2. Moderate episode of recurrent major depressive disorder ([place]) She is doing well with depression. She denies any medication side effects. Depression screen [facility] 2/9 04/21/2019 12/24/2018 09/22/2018  Decreased Interest 0 0 1  Down, Depressed, Hopeless 0 0 1  PHQ - 2 Score 0 0 2  Altered sleeping - - 0  Tired, decreased energy - - 0  Change in appetite - - 0  Feeling bad or failure about  yourself  - - 0  Trouble concentrating - - 0  Moving slowly or fidgety/restless - - 0  Suicidal thoughts - - 0  PHQ-9 Score - - 2  Difficult doing work/chores - - Somewhat difficult  Some recent data might be hidden     3. Generalized anxiety disorder Mom says that celexa helps some with her worrying. She would like to try switching from celexa to lexapro to see if will help better. Her anxiety is so bad hat she is not even able to drive. GAD 7 : Generalized Anxiety Score 04/21/2019 08/09/2015  Nervous, Anxious, on Edge 1 3  Control/stop worrying 3 0  Worry too much - different things 2 3  Trouble relaxing 1 3  Restless 0 0  Easily annoyed or irritable 0 3  Afraid - awful might happen 0 3  Total GAD 7 Score 7 15  Anxiety Difficulty Not difficult at all Very difficult            Review of Systems  Constitutional: Negative for diaphoresis and weight loss.  Eyes: Negative for blurred vision, double vision and pain.  Respiratory: Negative for shortness of breath.   Cardiovascular: Negative for chest pain, palpitations, orthopnea and leg swelling.  Gastrointestinal: Negative for abdominal pain.  Skin: Negative for rash.  Neurological: Negative for dizziness, sensory change, loss of consciousness, weakness and headaches.  Endo/Heme/Allergies: Negative for polydipsia. Does not bruise/bleed easily.  Psychiatric/Behavioral: Negative for memory loss. The patient does not have insomnia.   All other systems reviewed and are negative.      Observations/Objective: Alert and  oriented- answers all questions appropriately No distress Good eye contact  Assessment and Plan: Ruth Petty comes in today with chief complaint of No chief complaint on file.   Diagnosis and orders addressed:  1. Attention deficit hyperactivity disorder (ADHD), combined type Continue current dose of adderall - amphetamine-dextroamphetamine (ADDERALL XR) 30 MG 24 hr capsule; Take 1 capsule (30 mg  total) by mouth every morning. Take  1 capsule at 7am and one at 1pm daily  Dispense: 60 capsule; Refill: 0 - amphetamine-dextroamphetamine (ADDERALL XR) 30 MG 24 hr capsule; Take 1 capsule (30 mg total) by mouth every morning. Take  1 capsule at 7am and one at 1pm daily  Dispense: 60 capsule; Refill: 0 - amphetamine-dextroamphetamine (ADDERALL XR) 30 MG 24 hr capsule; Take 1 capsule (30 mg total) by mouth every morning. Take  1 capsule at 7am and one at 1pm daily  Dispense: 60 capsule; Refill: 0  2. Moderate episode of recurrent major depressive disorder (HCC) Stress management - escitalopram (LEXAPRO) 20 MG tablet; Take 1 tablet (20 mg total) by mouth daily.  Dispense: 30 tablet; Refill: 5  3. Generalized anxiety disorder Discussed new medication Stop celexa and start lexapro- no need to wean off of celexa - escitalopram (LEXAPRO) 20 MG tablet; Take 1 tablet (20 mg total) by mouth daily.  Dispense: 30 tablet; Refill: 5    Follow up plan: 3 months     I discussed the assessment and treatment plan with the patient. The patient was provided an opportunity to ask questions and all were answered. The patient agreed with the plan and demonstrated an understanding of the instructions.   The patient was advised to call back or seek an in-person evaluation if the symptoms worsen or if the condition fails to improve as anticipated.  The above assessment and management plan was discussed with the patient. The patient verbalized understanding of and has agreed to the management plan. Patient is aware to call the clinic if symptoms persist or worsen. Patient is aware when to return to the clinic for a follow-up visit. Patient educated on when it is appropriate to go to the emergency department.   Time call ended: 11:05  I provided 15 minutes of face-to-face time during this encounter.    Mary-Margaret Daphine Deutscher, FNP

## 2019-05-10 ENCOUNTER — Other Ambulatory Visit: Payer: Self-pay | Admitting: Nurse Practitioner

## 2019-05-10 DIAGNOSIS — F411 Generalized anxiety disorder: Secondary | ICD-10-CM

## 2019-05-13 ENCOUNTER — Other Ambulatory Visit: Payer: Self-pay | Admitting: Nurse Practitioner

## 2019-05-13 DIAGNOSIS — F411 Generalized anxiety disorder: Secondary | ICD-10-CM

## 2019-05-13 DIAGNOSIS — F331 Major depressive disorder, recurrent, moderate: Secondary | ICD-10-CM

## 2019-05-31 ENCOUNTER — Other Ambulatory Visit: Payer: Self-pay | Admitting: Nurse Practitioner

## 2019-05-31 DIAGNOSIS — F410 Panic disorder [episodic paroxysmal anxiety] without agoraphobia: Secondary | ICD-10-CM

## 2019-05-31 DIAGNOSIS — R0989 Other specified symptoms and signs involving the circulatory and respiratory systems: Secondary | ICD-10-CM

## 2019-08-23 ENCOUNTER — Other Ambulatory Visit: Payer: Self-pay | Admitting: Nurse Practitioner

## 2019-09-16 ENCOUNTER — Telehealth: Payer: Self-pay | Admitting: Family Medicine

## 2019-09-17 ENCOUNTER — Telehealth: Payer: Self-pay | Admitting: Nurse Practitioner

## 2019-09-17 NOTE — Telephone Encounter (Signed)
Mom called - appt made

## 2019-09-17 NOTE — Telephone Encounter (Signed)
  Incoming Patient Call  09/17/2019  What symptoms do you have? Blisters on tip on tongue and top of her lip  How long have you been sick? Since Sunday   Have you been seen for this problem? No  If your provider decides to give you a prescription, which pharmacy would you like for it to be sent to? [facility] [place], Pt mother is aware we do not have any appts, was advised to go to urgent care if she needed to be seen today, mom insisted on getting a video visit and to please ask Dolores Frame    Patient informed that this information will be sent to the clinical staff for review and that they should receive a follow up call.

## 2019-09-18 ENCOUNTER — Telehealth (INDEPENDENT_AMBULATORY_CARE_PROVIDER_SITE_OTHER): Payer: Self-pay | Admitting: Family Medicine

## 2019-09-18 ENCOUNTER — Encounter: Payer: Self-pay | Admitting: Family Medicine

## 2019-09-18 DIAGNOSIS — K1379 Other lesions of oral mucosa: Secondary | ICD-10-CM

## 2019-09-18 MED ORDER — MAGIC MOUTHWASH W/LIDOCAINE: 5.0000 mL | Freq: Four times a day (QID) | ORAL | 0 refills | Status: DC | PRN

## 2019-09-18 NOTE — Progress Notes (Signed)
Virtual Visit via Video note  I connected with REJEANA FADNESS on 09/18/19 at 9:33 AM by video and verified that I am speaking with the correct person using two identifiers. LINDSEE LABARRE is currently located in the car and her mom is currently with her during visit. The provider, Gwenlyn Fudge, FNP is located in their office at time of visit.  I discussed the limitations, risks, security and privacy concerns of performing an evaluation and management service by video and the availability of in person appointments. I also discussed with the patient that there may be a patient responsible charge related to this service. The patient expressed understanding and agreed to proceed.  Subjective: PCP: Bennie Pierini, FNP  Chief Complaint  Patient presents with  . Mouth Lesions   Patient reports painful red sores in her mouth that have been present x5 days. She has tried using Anbesol and some kind of spray which she felt made them worse. She received her 2nd COVID-19 vaccine 3 days ago and reports the sores got worse afterwards. She does feel they are better today than any other day.   ROS: Per HPI  Current Outpatient Medications:  .  albuterol (VENTOLIN HFA) 108 (90 Base) MCG/ACT inhaler, TAKE 2 PUFFS BY MOUTH EVERY 6 HOURS AS NEEDED FOR WHEEZE OR SHORTNESS OF BREATH, Disp: 18 g, Rfl: 0 .  amphetamine-dextroamphetamine (ADDERALL XR) 30 MG 24 hr capsule, Take 1 capsule (30 mg total) by mouth every morning. Take  1 capsule at 7am and one at 1pm daily, Disp: 60 capsule, Rfl: 0 .  amphetamine-dextroamphetamine (ADDERALL XR) 30 MG 24 hr capsule, Take 1 capsule (30 mg total) by mouth every morning. Take  1 capsule at 7am and one at 1pm daily, Disp: 60 capsule, Rfl: 0 .  amphetamine-dextroamphetamine (ADDERALL XR) 30 MG 24 hr capsule, Take 1 capsule (30 mg total) by mouth every morning. Take  1 capsule at 7am and one at 1pm daily, Disp: 60 capsule, Rfl: 0 .  busPIRone (BUSPAR) 15 MG  tablet, TAKE 1 TABLET (15 MG TOTAL) BY MOUTH 2 (TWO) TIMES DAILY.(Needs to be seen before next refill), Disp: 60 tablet, Rfl: 0 .  escitalopram (LEXAPRO) 20 MG tablet, TAKE 1 TABLET BY MOUTH EVERY DAY, Disp: 90 tablet, Rfl: 1 .  hydrOXYzine (VISTARIL) 25 MG capsule, Take 1 capsule (25 mg total) by mouth 3 (three) times daily as needed for anxiety., Disp: 30 capsule, Rfl: 0 .  JUNEL 1.5/30 1.5-30 MG-MCG tablet, Take 1 tablet by mouth daily., Disp: , Rfl: 4 .  traZODone (DESYREL) 50 MG tablet, TAKE 1/2 TO 1 TABLET (25-50 MG TOTAL) BY MOUTH AT BEDTIME AS NEEDED FOR SLEEP., Disp: 90 tablet, Rfl: 1 .  tretinoin (RETIN-A) 0.025 % cream, APPLY TO ACNE DAILY, Disp: , Rfl: 6  No Known Allergies Past Medical History:  Diagnosis Date  . ADHD (attention deficit hyperactivity disorder)     Observations/Objective: Physical Exam Constitutional:      General: She is not in acute distress.    Appearance: Normal appearance. She is not ill-appearing or toxic-appearing.  HENT:     Mouth/Throat:     Mouth: Oral lesions (2 red spots on the inside of the top lip) present.     Tongue: Lesions (3 red spots on the tip of the tongue) present.     Comments: From what I can see, the spots do not look like ulcers and they are not raised. Eyes:     General: No  scleral icterus.       Right eye: No discharge.        Left eye: No discharge.     Conjunctiva/sclera: Conjunctivae normal.  Pulmonary:     Effort: Pulmonary effort is normal. No respiratory distress.  Neurological:     Mental Status: She is alert and oriented to person, place, and time.  Psychiatric:        Mood and Affect: Mood normal.        Behavior: Behavior normal.        Thought Content: Thought content normal.        Judgment: Judgment normal.    Assessment and Plan: 1. Mouth sores - magic mouthwash w/lidocaine SOLN; Take 5 mLs by mouth 4 (four) times daily as needed for mouth pain.  Dispense: 140 mL; Refill: 0   Follow Up Instructions:     I discussed the assessment and treatment plan with the patient. The patient was provided an opportunity to ask questions and all were answered. The patient agreed with the plan and demonstrated an understanding of the instructions.   The patient was advised to call back or seek an in-person evaluation if the symptoms worsen or if the condition fails to improve as anticipated.  The above assessment and management plan was discussed with the patient. The patient verbalized understanding of and has agreed to the management plan. Patient is aware to call the clinic if symptoms persist or worsen. Patient is aware when to return to the clinic for a follow-up visit. Patient educated on when it is appropriate to go to the emergency department.   Time call ended: 9:41 AM  I provided 10 minutes of face-to-face time during this encounter.   Deliah Boston, MSN, APRN, FNP-C [facility] 09/18/19

## 2019-09-19 ENCOUNTER — Other Ambulatory Visit: Payer: Self-pay | Admitting: Nurse Practitioner

## 2019-10-03 ENCOUNTER — Other Ambulatory Visit: Payer: Self-pay | Admitting: Nurse Practitioner

## 2019-10-12 ENCOUNTER — Encounter: Payer: Self-pay | Admitting: Dermatology

## 2019-10-12 ENCOUNTER — Ambulatory Visit (INDEPENDENT_AMBULATORY_CARE_PROVIDER_SITE_OTHER): Payer: Commercial Managed Care - PPO | Admitting: Dermatology

## 2019-10-12 ENCOUNTER — Other Ambulatory Visit: Payer: Self-pay

## 2019-10-12 DIAGNOSIS — L700 Acne vulgaris: Secondary | ICD-10-CM

## 2019-10-12 MED ORDER — TAZAROTENE 0.1 % EX FOAM
CUTANEOUS | 1 refills | Status: DC
Start: 2019-10-12 — End: 2019-10-13

## 2019-10-12 NOTE — Progress Notes (Signed)
Previous acne meds mcn 50 bid epiduo benze foam 9.8% Adapalene 0.1% tazaroc gel 0.1% Tretinoin 0.025% aczone 7.5% Spironolactone 25mg  bid Referral to dr balan in 2017 elevated testosterone Isotretinoin 2017

## 2019-10-13 ENCOUNTER — Telehealth: Payer: Self-pay | Admitting: *Deleted

## 2019-10-13 MED ORDER — TAZAROTENE 0.1 % EX FOAM: CUTANEOUS | 1 refills | Status: DC

## 2019-10-13 NOTE — Telephone Encounter (Signed)
[facility]; sent to [facility] for $0 if covered $ 45 if not. Mom approved.

## 2019-10-14 ENCOUNTER — Other Ambulatory Visit: Payer: Self-pay | Admitting: Nurse Practitioner

## 2019-10-14 ENCOUNTER — Telehealth (INDEPENDENT_AMBULATORY_CARE_PROVIDER_SITE_OTHER): Payer: Self-pay | Admitting: Family Medicine

## 2019-10-14 ENCOUNTER — Encounter: Payer: Self-pay | Admitting: Family Medicine

## 2019-10-14 DIAGNOSIS — F902 Attention-deficit hyperactivity disorder, combined type: Secondary | ICD-10-CM

## 2019-10-14 DIAGNOSIS — F331 Major depressive disorder, recurrent, moderate: Secondary | ICD-10-CM

## 2019-10-14 DIAGNOSIS — F411 Generalized anxiety disorder: Secondary | ICD-10-CM

## 2019-10-14 DIAGNOSIS — R197 Diarrhea, unspecified: Secondary | ICD-10-CM

## 2019-10-14 MED ORDER — BUSPIRONE HCL 15 MG PO TABS: ORAL_TABLET | ORAL | 0 refills | Status: DC

## 2019-10-14 MED ORDER — ESCITALOPRAM OXALATE 20 MG PO TABS: 20.0000 mg | ORAL_TABLET | Freq: Every day | ORAL | 0 refills | Status: DC

## 2019-10-14 MED ORDER — AMPHETAMINE-DEXTROAMPHET ER 30 MG PO CP24: ORAL_CAPSULE | ORAL | 0 refills | Status: DC

## 2019-10-14 NOTE — Progress Notes (Signed)
Virtual Visit via Video note  I connected with Ruth Petty on 10/14/19 at 2:17 PM by video and verified that I am speaking with the correct person using two identifiers. Ruth Petty is currently located at her mom's salon and her mom is currently with her during visit. The provider, Gwenlyn Fudge, FNP is located in their home at time of visit.  I discussed the limitations, risks, security and privacy concerns of performing an evaluation and management service by video and the availability of in person appointments. I also discussed with the patient that there may be a patient responsible charge related to this service. The patient expressed understanding and agreed to proceed.  Subjective: PCP: Bennie Pierini, FNP  Chief Complaint  Patient presents with   Diarrhea   Patient reports she started having diarrhea 8 days ago.  She had eaten a taco salad prior to the onset of the diarrhea.  She describes stool as being "really watery and yellow/oily".  She has not taken any recent antibiotics.  She denies any abdominal pain, nausea, vomiting, fever, or unusual stress.  She had 5 episodes of diarrhea yesterday, but has had none today.   ROS: Per HPI  Current Outpatient Medications:    albuterol (VENTOLIN HFA) 108 (90 Base) MCG/ACT inhaler, TAKE 2 PUFFS BY MOUTH EVERY 6 HOURS AS NEEDED FOR WHEEZE OR SHORTNESS OF BREATH, Disp: 18 g, Rfl: 0   amphetamine-dextroamphetamine (ADDERALL XR) 30 MG 24 hr capsule, Take 1 capsule (30 mg total) by mouth every morning. Take  1 capsule at 7am and one at 1pm daily, Disp: 60 capsule, Rfl: 0   amphetamine-dextroamphetamine (ADDERALL XR) 30 MG 24 hr capsule, Take 1 capsule (30 mg total) by mouth every morning. Take  1 capsule at 7am and one at 1pm daily, Disp: 60 capsule, Rfl: 0   amphetamine-dextroamphetamine (ADDERALL XR) 30 MG 24 hr capsule, Take 1 capsule (30 mg total) by mouth every morning. Take  1 capsule at 7am and one at 1pm daily,  Disp: 60 capsule, Rfl: 0   busPIRone (BUSPAR) 15 MG tablet, TAKE 1 TABLET (15 MG TOTAL) BY MOUTH 2 (TWO) TIMES DAILY.(Needs to be seen before next refill), Disp: 60 tablet, Rfl: 0   escitalopram (LEXAPRO) 20 MG tablet, TAKE 1 TABLET BY MOUTH EVERY DAY, Disp: 90 tablet, Rfl: 1   hydrOXYzine (VISTARIL) 25 MG capsule, Take 1 capsule (25 mg total) by mouth 3 (three) times daily as needed for anxiety., Disp: 30 capsule, Rfl: 0   JUNEL 1.5/30 1.5-30 MG-MCG tablet, Take 1 tablet by mouth daily., Disp: , Rfl: 4   magic mouthwash w/lidocaine SOLN, Take 5 mLs by mouth 4 (four) times daily as needed for mouth pain., Disp: 140 mL, Rfl: 0   Tazarotene (FABIOR) 0.1 % FOAM, Apply topically at affected area 3 times a week, Disp: 50 g, Rfl: 1   traZODone (DESYREL) 50 MG tablet, TAKE 1/2 TO 1 TABLET (25-50 MG TOTAL) BY MOUTH AT BEDTIME AS NEEDED FOR SLEEP., Disp: 90 tablet, Rfl: 1   tretinoin (RETIN-A) 0.025 % cream, APPLY TO ACNE DAILY, Disp: , Rfl: 6  No Known Allergies Past Medical History:  Diagnosis Date   ADHD (attention deficit hyperactivity disorder)     Observations/Objective: Physical Exam Constitutional:      General: She is not in acute distress.    Appearance: Normal appearance. She is not ill-appearing or toxic-appearing.  Eyes:     General: No scleral icterus.  Right eye: No discharge.        Left eye: No discharge.     Conjunctiva/sclera: Conjunctivae normal.  Pulmonary:     Effort: Pulmonary effort is normal. No respiratory distress.  Neurological:     Mental Status: She is alert and oriented to person, place, and time.  Psychiatric:        Mood and Affect: Mood normal.        Behavior: Behavior normal.        Thought Content: Thought content normal.        Judgment: Judgment normal.    Assessment and Plan: 1. Diarrhea, unspecified type - Encouraged adequate hydration.  Recommended to patient that she could try taking Imodium AD to stop the diarrhea.  Mom is  curious if this is a viral gastroenteritis, but we discussed it would not last this long.  Stool testing ordered. - Cdiff NAA+O+P+Stool Culture; Future  2. Attention deficit hyperactivity disorder (ADHD), combined type - Patient requested a refill of her medications, which were provided with the understanding that she needed to schedule an appointment with her PCP within the next month for further refills. - amphetamine-dextroamphetamine (ADDERALL XR) 30 MG 24 hr capsule; Take 1 capsule by mouth at 7am and one at 1pm daily.  Dispense: 60 capsule; Refill: 0  3. Moderate episode of recurrent major depressive disorder (HCC) - Refilled Lexapro x30 days.  4. Generalized anxiety disorder - Refilled buspirone x30 days.   Follow Up Instructions: Return ASAP with PCP, for follow-up of chronic medication conditions.   I discussed the assessment and treatment plan with the patient. The patient was provided an opportunity to ask questions and all were answered. The patient agreed with the plan and demonstrated an understanding of the instructions.   The patient was advised to call back or seek an in-person evaluation if the symptoms worsen or if the condition fails to improve as anticipated.  The above assessment and management plan was discussed with the patient. The patient verbalized understanding of and has agreed to the management plan. Patient is aware to call the clinic if symptoms persist or worsen. Patient is aware when to return to the clinic for a follow-up visit. Patient educated on when it is appropriate to go to the emergency department.   Time call ended: 2:32 PM  I provided 17 minutes of face-to-face time during this encounter.   Deliah Boston, MSN, APRN, FNP-C [facility] 10/14/19

## 2019-10-29 ENCOUNTER — Other Ambulatory Visit: Payer: Self-pay | Admitting: Nurse Practitioner

## 2019-10-29 DIAGNOSIS — F5104 Psychophysiologic insomnia: Secondary | ICD-10-CM

## 2019-10-29 DIAGNOSIS — F411 Generalized anxiety disorder: Secondary | ICD-10-CM

## 2019-11-06 ENCOUNTER — Other Ambulatory Visit: Payer: Self-pay

## 2019-11-06 ENCOUNTER — Encounter: Payer: Self-pay | Admitting: Nurse Practitioner

## 2019-11-06 ENCOUNTER — Ambulatory Visit (INDEPENDENT_AMBULATORY_CARE_PROVIDER_SITE_OTHER): Payer: Commercial Managed Care - PPO | Admitting: Nurse Practitioner

## 2019-11-06 VITALS — BP 123/74 | HR 118 | Temp 98.2°F | Ht 67.0 in | Wt 140.2 lb

## 2019-11-06 DIAGNOSIS — F902 Attention-deficit hyperactivity disorder, combined type: Secondary | ICD-10-CM

## 2019-11-06 DIAGNOSIS — F411 Generalized anxiety disorder: Secondary | ICD-10-CM

## 2019-11-06 DIAGNOSIS — F5104 Psychophysiologic insomnia: Secondary | ICD-10-CM

## 2019-11-06 DIAGNOSIS — F331 Major depressive disorder, recurrent, moderate: Secondary | ICD-10-CM

## 2019-11-06 MED ORDER — AMPHETAMINE-DEXTROAMPHET ER 30 MG PO CP24: 30.0000 mg | ORAL_CAPSULE | ORAL | 0 refills | Status: DC

## 2019-11-06 MED ORDER — ESCITALOPRAM OXALATE 20 MG PO TABS: 20.0000 mg | ORAL_TABLET | Freq: Every day | ORAL | 1 refills | Status: DC

## 2019-11-06 MED ORDER — TRAZODONE HCL 50 MG PO TABS: 50.0000 mg | ORAL_TABLET | Freq: Every day | ORAL | 0 refills | Status: DC

## 2019-11-06 MED ORDER — AMPHETAMINE-DEXTROAMPHET ER 30 MG PO CP24: 30.0000 mg | ORAL_CAPSULE | Freq: Every day | ORAL | 0 refills | Status: DC

## 2019-11-06 NOTE — Progress Notes (Signed)
Subjective:    Patient ID: Ruth Petty, female    DOB: Sep 03, 1995, 24 y.o.   MRN: [id number]   Chief Complaint: No chief complaint on file.    HPI:  1. Attention deficit hyperactivity disorder (ADHD), combined type Patient is currently on adderall XR 30mg  daily. She is doing well. Denies any side effects. Still in college [employer] and grades are good.  2. Moderate episode of recurrent major depressive disorder (HCC) Is currently on lexapro daily and is doing well. Depression screen [facility] 2/9 11/06/2019 04/21/2019 12/24/2018  Decreased Interest 0 0 0  Down, Depressed, Hopeless 0 0 0  PHQ - 2 Score 0 0 0  Altered sleeping - - -  Tired, decreased energy - - -  Change in appetite - - -  Feeling bad or failure about yourself  - - -  Trouble concentrating - - -  Moving slowly or fidgety/restless - - -  Suicidal thoughts - - -  PHQ-9 Score - - -  Difficult doing work/chores - - -  Some recent data might be hidden     3. Generalized anxiety disorder Has buspar that takes as needed. She takes it daily to help hr through the day. GAD 7 : Generalized Anxiety Score 11/06/2019 04/21/2019 08/09/2015  Nervous, Anxious, on Edge 1 1 3   Control/stop worrying 0 3 0  Worry too much - different things 3 2 3   Trouble relaxing 0 1 3  Restless 0 0 0  Easily annoyed or irritable 1 0 3  Afraid - awful might happen 0 0 3  Total GAD 7 Score 5 7 15   Anxiety Difficulty Somewhat difficult Not difficult at all Very difficult      4. insomnia She has trazadone to sleep. Only takes half of tablet when she needs it.   Outpatient Encounter Medications as of 11/06/2019  Medication Sig   albuterol (VENTOLIN HFA) 108 (90 Base) MCG/ACT inhaler TAKE 2 PUFFS BY MOUTH EVERY 6 HOURS AS NEEDED FOR WHEEZE OR SHORTNESS OF BREATH   amphetamine-dextroamphetamine (ADDERALL XR) 30 MG 24 hr capsule Take 1 capsule (30 mg total) by mouth every morning. Take  1 capsule at 7am and one at 1pm daily    amphetamine-dextroamphetamine (ADDERALL XR) 30 MG 24 hr capsule Take 1 capsule (30 mg total) by mouth every morning. Take  1 capsule at 7am and one at 1pm daily   amphetamine-dextroamphetamine (ADDERALL XR) 30 MG 24 hr capsule Take 1 capsule by mouth at 7am and one at 1pm daily.   busPIRone (BUSPAR) 15 MG tablet TAKE 1 TABLET (15 MG TOTAL) BY MOUTH 2 (TWO) TIMES DAILY.(Needs to be seen before next refill)   escitalopram (LEXAPRO) 20 MG tablet TAKE 1 TABLET BY MOUTH EVERY DAY   hydrOXYzine (VISTARIL) 25 MG capsule Take 1 capsule (25 mg total) by mouth 3 (three) times daily as needed for anxiety.   JUNEL 1.5/30 1.5-30 MG-MCG tablet Take 1 tablet by mouth daily.   magic mouthwash w/lidocaine SOLN Take 5 mLs by mouth 4 (four) times daily as needed for mouth pain.   Tazarotene (FABIOR) 0.1 % FOAM Apply topically at affected area 3 times a week   traZODone (DESYREL) 50 MG tablet TAKE 1/2 TO 1 TABLET (25-50 MG TOTAL) BY MOUTH AT BEDTIME AS NEEDED FOR SLEEP.   tretinoin (RETIN-A) 0.025 % cream APPLY TO ACNE DAILY    No past surgical history on file.  No family history on file.  New complaints: none  Social history:  Lives in [address] in apartment  Controlled substance contract: n/a    Review of Systems  Constitutional: Negative for diaphoresis.  Eyes: Negative for pain.  Respiratory: Negative for shortness of breath.   Cardiovascular: Negative for chest pain, palpitations and leg swelling.  Gastrointestinal: Negative for abdominal pain.  Endocrine: Negative for polydipsia.  Skin: Negative for rash.  Neurological: Negative for dizziness, weakness and headaches.  Hematological: Does not bruise/bleed easily.  All other systems reviewed and are negative.      Objective:   Physical Exam Vitals and nursing note reviewed.  Constitutional:      Appearance: Normal appearance.  Cardiovascular:     Rate and Rhythm: Normal rate and regular rhythm.     Heart sounds: Normal heart  sounds.  Pulmonary:     Effort: Pulmonary effort is normal.     Breath sounds: Normal breath sounds.  Skin:    General: Skin is warm.  Neurological:     General: No focal deficit present.     Mental Status: She is alert.  Psychiatric:        Mood and Affect: Mood normal.        Behavior: Behavior normal.     BP 123/74    Pulse (!) 118    Temp 98.2 F (36.8 C) (Temporal)    Ht 5\' 7"  (1.702 m)    Wt 140 lb 3.2 oz (63.6 kg)    BMI 21.96 kg/m        Assessment & Plan:  Ruth Petty comes in today with chief complaint of Medication Refill   Diagnosis and orders addressed:  1. Attention deficit hyperactivity disorder (ADHD), combined type Stress management - amphetamine-dextroamphetamine (ADDERALL XR) 30 MG 24 hr capsule; Take 1 capsule (30 mg total) by mouth daily. Take 1 capsule by mouth at 7am and one at 1pm daily.  Dispense: 60 capsule; Refill: 0 - amphetamine-dextroamphetamine (ADDERALL XR) 30 MG 24 hr capsule; Take 1 capsule (30 mg total) by mouth every morning. Take  1 capsule at 7am and one at 1pm daily  Dispense: 60 capsule; Refill: 0 - amphetamine-dextroamphetamine (ADDERALL XR) 30 MG 24 hr capsule; Take 1 capsule (30 mg total) by mouth every morning. Take  1 capsule at 7am and one at 1pm daily  Dispense: 60 capsule; Refill: 0  2. Moderate episode of recurrent major depressive disorder (HCC) - escitalopram (LEXAPRO) 20 MG tablet; Take 1 tablet (20 mg total) by mouth daily.  Dispense: 90 tablet; Refill: 1  3. Generalized anxiety disorder - escitalopram (LEXAPRO) 20 MG tablet; Take 1 tablet (20 mg total) by mouth daily.  Dispense: 90 tablet; Refill: 1 - traZODone (DESYREL) 50 MG tablet; Take 1 tablet (50 mg total) by mouth at bedtime.  Dispense: 90 tablet; Refill: 0  4. Psychophysiological insomnia Bedtime routine - traZODone (DESYREL) 50 MG tablet; Take 1 tablet (50 mg total) by mouth at bedtime.  Dispense: 90 tablet; Refill: 0   Labs pending Health Maintenance  reviewed Diet and exercise encouraged  Follow up plan: prn   Mary-Margaret Jackqulyn Livings, FNP

## 2019-11-06 NOTE — Patient Instructions (Signed)
Stress, Adult °Stress is a normal reaction to life events. Stress is what you feel when life demands more than you are used to, or more than you think you can handle. Some stress can be useful, such as studying for a test or meeting a deadline at work. Stress that occurs too often or for too long can cause problems. It can affect your emotional health and interfere with relationships and normal daily activities. Too much stress can weaken your body's defense system (immune system) and increase your risk for physical illness. If you already have a medical problem, stress can make it worse. °What are the causes? °All sorts of life events can cause stress. An event that causes stress for one person may not be stressful for another person. Major life events, whether positive or negative, commonly cause stress. Examples include: °· Losing a job or starting a new job. °· Losing a loved one. °· Moving to a new town or home. °· Getting married or divorced. °· Having a baby. °· Getting injured or sick. °Less obvious life events can also cause stress, especially if they occur day after day or in combination with each other. Examples include: °· Working long hours. °· Driving in traffic. °· Caring for children. °· Being in debt. °· Being in a difficult relationship. °What are the signs or symptoms? °Stress can cause emotional symptoms, including: °· Anxiety. This is feeling worried, afraid, on edge, overwhelmed, or out of control. °· Anger, including irritation or impatience. °· Depression. This is feeling sad, down, helpless, or guilty. °· Trouble focusing, remembering, or making decisions. °Stress can cause physical symptoms, including: °· Aches and pains. These may affect your head, neck, back, stomach, or other areas of your body. °· Tight muscles or a clenched jaw. °· Low energy. °· Trouble sleeping. °Stress can cause unhealthy behaviors, including: °· Eating to feel better (overeating) or skipping meals. °· Working too  much or putting off tasks. °· Smoking, drinking alcohol, or using drugs to feel better. °How is this diagnosed? °Stress is diagnosed through an assessment by your health care provider. He or she may diagnose this condition based on: °· Your symptoms and any stressful life events. °· Your medical history. °· Tests to rule out other causes of your symptoms. °Depending on your condition, your health care provider may refer you to a specialist for further evaluation. °How is this treated? ° °Stress management techniques are the recommended treatment for stress. Medicine is not typically recommended for the treatment of stress. °Techniques to reduce your reaction to stressful life events include: °· Stress identification. Monitor yourself for symptoms of stress and identify what causes stress for you. These skills may help you to avoid or prepare for stressful events. °· Time management. Set your priorities, keep a calendar of events, and learn to say no. Taking these actions can help you avoid making too many commitments. °Techniques for coping with stress include: °· Rethinking the problem. Try to think realistically about stressful events rather than ignoring them or overreacting. Try to find the positives in a stressful situation rather than focusing on the negatives. °· Exercise. Physical exercise can release both physical and emotional tension. The key is to find a form of exercise that you enjoy and do it regularly. °· Relaxation techniques. These relax the body and mind. The key is to find one or more that you enjoy and use the techniques regularly. Examples include: °? Meditation, deep breathing, or progressive relaxation techniques. °? Yoga or   tai chi. ? Biofeedback, mindfulness techniques, or journaling. ? Listening to music, being out in nature, or participating in other hobbies.  Practicing a healthy lifestyle. Eat a balanced diet, drink plenty of water, limit or avoid caffeine, and get plenty of  sleep.  Having a strong support network. Spend time with family, friends, or other people you enjoy being around. Express your feelings and talk things over with someone you trust. Counseling or talk therapy with a mental health professional may be helpful if you are having trouble managing stress on your own. Follow these instructions at home: Lifestyle   Avoid drugs.  Do not use any products that contain nicotine or tobacco, such as cigarettes, e-cigarettes, and chewing tobacco. If you need help quitting, ask your health care provider.  Limit alcohol intake to no more than 1 drink a day for nonpregnant women and 2 drinks a day for men. One drink equals 12 oz of beer, 5 oz of wine, or 1 oz of hard liquor  Do not use alcohol or drugs to relax.  Eat a balanced diet that includes fresh fruits and vegetables, whole grains, lean meats, fish, eggs, and beans, and low-fat dairy. Avoid processed foods and foods high in added fat, sugar, and salt.  Exercise at least 30 minutes on 5 or more days each week.  Get 7-8 hours of sleep each night. General instructions   Practice stress management techniques as discussed with your health care provider.  Drink enough fluid to keep your urine clear or pale yellow.  Take over-the-counter and prescription medicines only as told by your health care provider.  Keep all follow-up visits as told by your health care provider. This is important. Contact a health care provider if:  Your symptoms get worse.  You have new symptoms.  You feel overwhelmed by your problems and can no longer manage them on your own. Get help right away if:  You have thoughts of hurting yourself or others. If you ever feel like you may hurt yourself or others, or have thoughts about taking your own life, get help right away. You can go to your nearest emergency department or call:  Your local emergency services (911 in the [place]  A suicide crisis helpline, such as the  [facility] at (906)269-9854. This is open 24 hours a day. Summary  Stress is a normal reaction to life events. It can cause problems if it happens too often or for too long.  Practicing stress management techniques is the best way to treat stress.  Counseling or talk therapy with a mental health professional may be helpful if you are having trouble managing stress on your own. This information is not intended to replace advice given to you by your health care provider. Make sure you discuss any questions you have with your health care provider. Document Revised: 07/25/2018 Document Reviewed: 02/15/2016 Elsevier Patient Education  [place].

## 2019-11-08 ENCOUNTER — Other Ambulatory Visit: Payer: Self-pay | Admitting: Family Medicine

## 2019-11-15 ENCOUNTER — Encounter: Payer: Self-pay | Admitting: Dermatology

## 2019-11-15 NOTE — Progress Notes (Signed)
  New patient visit   Subjective  Ruth Petty is a 24 y.o. female who presents for the following: Acne (FACE AND 1 BUMP ON BACK). Acne Location:  Duration: Dominantly face Quality:  Associated Signs/Symptoms: Modifying Factors: Previous course of isotretinoin Severity:  Timing: Context:   Objective  Well appearing patient in no apparent distress; mood and affect are within normal limits.  A focused examination was performed including Head, neck, and back.. Relevant physical exam findings are noted in the Assessment and Plan.   Assessment & Plan    Acne vulgaris Head - Anterior (Face)  Generic Fabior foam every 1-3 nights.  Please call the office if this is a high out-of-pocket cost.  Trial 10 weeks and then follow-up.     I, Janalyn Harder, MD, have reviewed all documentation for this visit.  The documentation on 11/15/19 for the exam, diagnosis, procedures, and orders are all accurate and complete.

## 2019-12-02 ENCOUNTER — Other Ambulatory Visit: Payer: Self-pay | Admitting: Nurse Practitioner

## 2020-02-05 ENCOUNTER — Ambulatory Visit (INDEPENDENT_AMBULATORY_CARE_PROVIDER_SITE_OTHER): Payer: Commercial Managed Care - PPO | Admitting: Family Medicine

## 2020-02-05 DIAGNOSIS — Z20822 Contact with and (suspected) exposure to covid-19: Secondary | ICD-10-CM | POA: Diagnosis not present

## 2020-02-05 MED ORDER — PROMETHAZINE-DM 6.25-15 MG/5ML PO SYRP: 2.5000 mL | ORAL_SOLUTION | Freq: Four times a day (QID) | ORAL | 0 refills | Status: DC | PRN

## 2020-02-05 MED ORDER — BENZONATATE 100 MG PO CAPS: 100.0000 mg | ORAL_CAPSULE | Freq: Three times a day (TID) | ORAL | 0 refills | Status: DC | PRN

## 2020-02-05 NOTE — Patient Instructions (Signed)

## 2020-02-05 NOTE — Progress Notes (Signed)
Telephone visit  Subjective: CC: ?COVID PCP: Bennie Pierini, FNP DTO:[id number] Ruth Petty is a 25 y.o. female calls for telephone consult today. Patient provides verbal consent for consult held via phone.  Due to COVID-19 pandemic this visit was conducted virtually. This visit type was conducted due to national recommendations for restrictions regarding the COVID-19 Pandemic (e.g. social distancing, sheltering in place) in an effort to limit this patient's exposure and mitigate transmission in our community. All issues noted in this document were discussed and addressed.  A physical exam was not performed with this format.   Location of patient: home Location of provider: [facility] Others present for call: none  1. COVID Patient reports that mother just recovered from COVID and patient now has cough, stuffy nose, fever, chills and mild nausea.  She reports symptoms started last night.  No vomiting.  No reports of diarrhea.  She is hydrating without difficulty.  LMP: 01/28/2020.   ROS: Per HPI  No Known Allergies Past Medical History:  Diagnosis Date  . ADHD (attention deficit hyperactivity disorder)     Current Outpatient Medications:  .  albuterol (VENTOLIN HFA) 108 (90 Base) MCG/ACT inhaler, TAKE 2 PUFFS BY MOUTH EVERY 6 HOURS AS NEEDED FOR WHEEZE OR SHORTNESS OF BREATH, Disp: 18 g, Rfl: 0 .  amphetamine-dextroamphetamine (ADDERALL XR) 30 MG 24 hr capsule, Take 1 capsule (30 mg total) by mouth daily. Take 1 capsule by mouth at 7am and one at 1pm daily., Disp: 60 capsule, Rfl: 0 .  amphetamine-dextroamphetamine (ADDERALL XR) 30 MG 24 hr capsule, Take 1 capsule (30 mg total) by mouth every morning. Take  1 capsule at 7am and one at 1pm daily, Disp: 60 capsule, Rfl: 0 .  amphetamine-dextroamphetamine (ADDERALL XR) 30 MG 24 hr capsule, Take 1 capsule (30 mg total) by mouth every morning. Take  1 capsule at 7am and one at 1pm daily, Disp: 60 capsule, Rfl: 0 .  busPIRone (BUSPAR) 15 MG  tablet, TAKE 1 TABLET BY MOUTH 2 TIMES DAILY., Disp: 180 tablet, Rfl: 1 .  escitalopram (LEXAPRO) 20 MG tablet, Take 1 tablet (20 mg total) by mouth daily., Disp: 90 tablet, Rfl: 1 .  hydrOXYzine (VISTARIL) 25 MG capsule, Take 1 capsule (25 mg total) by mouth 3 (three) times daily as needed for anxiety., Disp: 30 capsule, Rfl: 0 .  JUNEL 1.5/30 1.5-30 MG-MCG tablet, Take 1 tablet by mouth daily., Disp: , Rfl: 4 .  magic mouthwash w/lidocaine SOLN, Take 5 mLs by mouth 4 (four) times daily as needed for mouth pain. (Patient not taking: Reported on 11/06/2019), Disp: 140 mL, Rfl: 0 .  Tazarotene (FABIOR) 0.1 % FOAM, Apply topically at affected area 3 times a week, Disp: 50 g, Rfl: 1 .  traZODone (DESYREL) 50 MG tablet, Take 1 tablet (50 mg total) by mouth at bedtime., Disp: 90 tablet, Rfl: 0 .  tretinoin (RETIN-A) 0.025 % cream, APPLY TO ACNE DAILY, Disp: , Rfl: 6  Assessment/ Plan: 25 y.o. female   Contact with and (suspected) exposure to covid-19 - Plan: promethazine-dextromethorphan (PROMETHAZINE-DM) 6.25-15 MG/5ML syrup, benzonatate (TESSALON PERLES) 100 MG capsule, Novel Coronavirus, NAA ([facility]), Veritor Flu A/B Waived  Highly suspicious for COVID-19.  We will check for flu as well given abrupt onset.  I have given her promethazine-DM and Tessalon Perles to have for cough and mild nausea.  We discussed adequate hydration.  At this point I do not think that she needs corticosteroids nor Z-Pak but we discussed reasons for reevaluation.  She knows  that I am on-call over the weekend if symptoms progress she will contact me immediately.  While her window of infection is appropriate for antivirals, she is not considered "high risk" and therefore she does not meet current criteria for antivirals/ immunoglobulin therapy  Start time: 9:53am End time: 10:01am  Total time spent on patient care (including telephone call/ virtual visit): 8 minutes  Ashly Hulen Skains, DO [facility] [phone]

## 2020-03-30 ENCOUNTER — Other Ambulatory Visit: Payer: Self-pay | Admitting: Nurse Practitioner

## 2020-03-30 DIAGNOSIS — F411 Generalized anxiety disorder: Secondary | ICD-10-CM

## 2020-03-30 DIAGNOSIS — F5104 Psychophysiologic insomnia: Secondary | ICD-10-CM

## 2020-05-26 ENCOUNTER — Other Ambulatory Visit: Payer: Self-pay

## 2020-05-26 ENCOUNTER — Encounter: Payer: Self-pay | Admitting: Nurse Practitioner

## 2020-05-26 ENCOUNTER — Ambulatory Visit (INDEPENDENT_AMBULATORY_CARE_PROVIDER_SITE_OTHER): Payer: Commercial Managed Care - PPO | Admitting: Nurse Practitioner

## 2020-05-26 VITALS — BP 122/76 | HR 120 | Temp 97.6°F | Resp 20 | Ht 67.0 in | Wt 141.0 lb

## 2020-05-26 DIAGNOSIS — F5104 Psychophysiologic insomnia: Secondary | ICD-10-CM | POA: Diagnosis not present

## 2020-05-26 DIAGNOSIS — F902 Attention-deficit hyperactivity disorder, combined type: Secondary | ICD-10-CM | POA: Diagnosis not present

## 2020-05-26 DIAGNOSIS — F411 Generalized anxiety disorder: Secondary | ICD-10-CM

## 2020-05-26 DIAGNOSIS — F331 Major depressive disorder, recurrent, moderate: Secondary | ICD-10-CM | POA: Diagnosis not present

## 2020-05-26 MED ORDER — AMPHETAMINE-DEXTROAMPHET ER 30 MG PO CP24: 30.0000 mg | ORAL_CAPSULE | ORAL | 0 refills | Status: DC

## 2020-05-26 MED ORDER — AMPHETAMINE-DEXTROAMPHET ER 30 MG PO CP24
30.0000 mg | ORAL_CAPSULE | ORAL | 0 refills | Status: DC
Start: 2020-05-26 — End: 2020-11-08

## 2020-05-26 MED ORDER — AMPHETAMINE-DEXTROAMPHET ER 30 MG PO CP24: 30.0000 mg | ORAL_CAPSULE | Freq: Every day | ORAL | 0 refills | Status: DC

## 2020-05-26 MED ORDER — TRAZODONE HCL 50 MG PO TABS: ORAL_TABLET | ORAL | 1 refills | Status: DC

## 2020-05-26 MED ORDER — BUSPIRONE HCL 15 MG PO TABS: ORAL_TABLET | ORAL | 0 refills | Status: DC

## 2020-05-26 MED ORDER — ESCITALOPRAM OXALATE 20 MG PO TABS
20.0000 mg | ORAL_TABLET | Freq: Every day | ORAL | 1 refills | Status: DC
Start: 2020-05-26 — End: 2020-11-08

## 2020-05-26 NOTE — Progress Notes (Signed)
Subjective:    Patient ID: Ruth Petty, female    DOB: 06/21/1995, 25 y.o.   MRN: [id number]   Chief Complaint: medical management of chronic issues     HPI:  1. Attention deficit hyperactivity disorder (ADHD), combined type patient is currently on adderall XR 30mg  daily. She just graduated from college and cannot concentrate on school work without medication. She has medication side effects.  2. Moderate episode of recurrent major depressive disorder [facility]) Patient is on lexapro daily and is doing well. She deneis any medication side effects. Depression screen [facility] 2/9 05/26/2020 11/06/2019 04/21/2019 12/24/2018 09/22/2018  Decreased Interest 1 0 0 0 1  Down, Depressed, Hopeless 1 0 0 0 1  PHQ - 2 Score 2 0 0 0 2  Altered sleeping 1 - - - 0  Tired, decreased energy 2 - - - 0  Change in appetite 0 - - - 0  Feeling bad or failure about yourself  1 - - - 0  Trouble concentrating 0 - - - 0  Moving slowly or fidgety/restless 0 - - - 0  Suicidal thoughts 0 - - - 0  PHQ-9 Score 6 - - - 2  Difficult doing work/chores Not difficult at all - - - Somewhat difficult  Some recent data might be hidden     3. Generalized anxiety disorder She has buspar to take as needed. Usually takes 1x a day. GAD 7 : Generalized Anxiety Score 05/26/2020 11/06/2019 04/21/2019 08/09/2015  Nervous, Anxious, on Edge 1 1 1 3   Control/stop worrying 2 0 3 0  Worry too much - different things 2 3 2 3   Trouble relaxing 0 0 1 3  Restless 0 0 0 0  Easily annoyed or irritable 2 1 0 3  Afraid - awful might happen 0 0 0 3  Total GAD 7 Score 7 5 7 15   Anxiety Difficulty Not difficult at all Somewhat difficult Not difficult at all Very difficult    4. insomnia Is on trazadone and is working well.sleeps 7-8 hours a night.   Outpatient Encounter Medications as of 05/26/2020  Medication Sig  . albuterol (VENTOLIN HFA) 108 (90 Base) MCG/ACT inhaler TAKE 2 PUFFS BY MOUTH EVERY 6 HOURS AS NEEDED FOR WHEEZE OR  SHORTNESS OF BREATH  . amphetamine-dextroamphetamine (ADDERALL XR) 30 MG 24 hr capsule Take 1 capsule (30 mg total) by mouth daily. Take 1 capsule by mouth at 7am and one at 1pm daily.  amphetamine-dextroamphetamine (ADDERALL XR) 30 MG 24 hr capsule Take 1 capsule (30 mg total) by mouth every morning. Take  1 capsule at 7am and one at 1pm daily  . amphetamine-dextroamphetamine (ADDERALL XR) 30 MG 24 hr capsule Take 1 capsule (30 mg total) by mouth every morning. Take  1 capsule at 7am and one at 1pm daily  . benzonatate (TESSALON PERLES) 100 MG capsule Take 1 capsule (100 mg total) by mouth 3 (three) times daily as needed.  . busPIRone (BUSPAR) 15 MG tablet TAKE 1 TABLET BY MOUTH TWICE A DAY  . escitalopram (LEXAPRO) 20 MG tablet Take 1 tablet (20 mg total) by mouth daily.  . hydrOXYzine (VISTARIL) 25 MG capsule Take 1 capsule (25 mg total) by mouth 3 (three) times daily as needed for anxiety.  1.5/30 1.5-30 MG-MCG tablet Take 1 tablet by mouth daily.  . magic mouthwash w/lidocaine SOLN Take 5 mLs by mouth 4 (four) times daily as needed for mouth pain. (Patient not taking: Reported on 11/06/2019)  .  promethazine-dextromethorphan (PROMETHAZINE-DM) 6.25-15 MG/5ML syrup Take 2.5 mLs by mouth 4 (four) times daily as needed for cough.  . Tazarotene (FABIOR) 0.1 % FOAM Apply topically at affected area 3 times a week  . traZODone (DESYREL) 50 MG tablet TAKE 1 TABLET BY MOUTH EVERYDAY AT BEDTIME  . tretinoin (RETIN-A) 0.025 % cream APPLY TO ACNE DAILY   No facility-administered encounter medications on file as of 05/26/2020.    No past surgical history on file.  No family history on file.  New complaints: None today  Social history: Lives with her parents. Is trying  To get in law school.  Controlled substance contract: 05/26/20    Review of Systems  Constitutional: Negative for diaphoresis.  Eyes: Negative for pain.  Respiratory: Negative for shortness of breath.    Cardiovascular: Negative for chest pain, palpitations and leg swelling.  Gastrointestinal: Negative for abdominal pain.  Endocrine: Negative for polydipsia.  Skin: Negative for rash.  Neurological: Negative for dizziness, weakness and headaches.  Hematological: Does not bruise/bleed easily.  All other systems reviewed and are negative.      Objective:   Physical Exam Vitals and nursing note reviewed.  Constitutional:      Appearance: Normal appearance.  Cardiovascular:     Rate and Rhythm: Normal rate and regular rhythm.     Heart sounds: Normal heart sounds.  Pulmonary:     Effort: Pulmonary effort is normal.     Breath sounds: Normal breath sounds.  Skin:    General: Skin is warm.  Neurological:     General: No focal deficit present.     Mental Status: She is alert and oriented to person, place, and time.  Psychiatric:        Mood and Affect: Mood normal.        Behavior: Behavior normal.    BP 122/76   Pulse (!) 120   Temp 97.6 F (36.4 C) (Temporal)   Resp 20   Ht 5\' 7"  (1.702 m)   Wt 141 lb (64 kg)   SpO2 99%   BMI 22.08 kg/m         Assessment & Plan:  in today with chief complaint of Medical Management of Chronic Issues   1. Attention deficit hyperactivity disorder (ADHD), combined type Stress management - amphetamine-dextroamphetamine (ADDERALL XR) 30 MG 24 hr capsule; Take 1 capsule (30 mg total) by mouth daily. Take 1 capsule by mouth at 7am and one at 1pm daily.  Dispense: 60 capsule; Refill: 0 - amphetamine-dextroamphetamine (ADDERALL XR) 30 MG 24 hr capsule; Take 1 capsule (30 mg total) by mouth every morning. Take  1 capsule at 7am and one at 1pm daily  Dispense: 60 capsule; Refill: 0 - amphetamine-dextroamphetamine (ADDERALL XR) 30 MG 24 hr capsule; Take 1 capsule (30 mg total) by mouth every morning. Take  1 capsule at 7am and one at 1pm daily  Dispense: 60 capsule; Refill: 0  2. Moderate episode of recurrent major depressive  disorder (HCC)  - escitalopram (LEXAPRO) 20 MG tablet; Take 1 tablet (20 mg total) by mouth daily.  Dispense: 90 tablet; Refill: 1  3. Generalized anxiety disorder - busPIRone (BUSPAR) 15 MG tablet; TAKE 1 TABLET BY MOUTH TWICE A DAY  Dispense: 180 tablet; Refill: 0  4. Psychophysiological insomnia Bedtime routine - traZODone (DESYREL) 50 MG tablet; TAKE 1 TABLET BY MOUTH EVERYDAY AT BEDTIME  Dispense: 90 tablet; Refill: 1    The above assessment and management plan was discussed with the  patient. The patient verbalized understanding of and has agreed to the management plan. Patient is aware to call the clinic if symptoms persist or worsen. Patient is aware when to return to the clinic for a follow-up visit. Patient educated on when it is appropriate to go to the emergency department.   Mary-Margaret Hassell Done, FNP

## 2020-05-26 NOTE — Patient Instructions (Signed)
Textbook of family medicine (9th ed., pp. 1062-1073). [place], [place]: [employer].">  Stress, Adult Stress is a normal reaction to life events. Stress is what you feel when life demands more than you are used to, or more than you think you can handle. Some stress can be useful, such as studying for a test or meeting a deadline at work. Stress that occurs too often or for too long can cause problems. It can affect your emotional health and interfere with relationships and normal daily activities. Too much stress can weaken your body's defense system (immune system) and increase your risk for physical illness. If you already have a medical problem, stress can make it worse. What are the causes? All sorts of life events can cause stress. An event that causes stress for one person may not be stressful for another person. Major life events, whether positive or negative, commonly cause stress. Examples include:  Losing a job or starting a new job.  Losing a loved one.  Moving to a new town or home.  Getting married or divorced.  Having a baby.  Getting injured or sick. Less obvious life events can also cause stress, especially if they occur day after day or in combination with each other. Examples include:  Working long hours.  Driving in traffic.  Caring for children.  Being in debt.  Being in a difficult relationship. What are the signs or symptoms? Stress can cause emotional symptoms, including:  Anxiety. This is feeling worried, afraid, on edge, overwhelmed, or out of control.  Anger, including irritation or impatience.  Depression. This is feeling sad, down, helpless, or guilty.  Trouble focusing, remembering, or making decisions. Stress can cause physical symptoms, including:  Aches and pains. These may affect your head, neck, back, stomach, or other areas of your body.  Tight muscles or a clenched jaw.  Low energy.  Trouble sleeping. Stress can cause unhealthy  behaviors, including:  Eating to feel better (overeating) or skipping meals.  Working too much or putting off tasks.  Smoking, drinking alcohol, or using drugs to feel better. How is this diagnosed? Stress is diagnosed through an assessment by your health care provider. He or she may diagnose this condition based on:  Your symptoms and any stressful life events.  Your medical history.  Tests to rule out other causes of your symptoms. Depending on your condition, your health care provider may refer you to a specialist for further evaluation. How is this treated? Stress management techniques are the recommended treatment for stress. Medicine is not typically recommended for the treatment of stress. Techniques to reduce your reaction to stressful life events include:  Stress identification. Monitor yourself for symptoms of stress and identify what causes stress for you. These skills may help you to avoid or prepare for stressful events.  Time management. Set your priorities, keep a calendar of events, and learn to say no. Taking these actions can help you avoid making too many commitments. Techniques for coping with stress include:  Rethinking the problem. Try to think realistically about stressful events rather than ignoring them or overreacting. Try to find the positives in a stressful situation rather than focusing on the negatives.  Exercise. Physical exercise can release both physical and emotional tension. The key is to find a form of exercise that you enjoy and do it regularly.  Relaxation techniques. These relax the body and mind. The key is to find one or more that you enjoy and use the techniques regularly. Examples include: ?   Meditation, deep breathing, or progressive relaxation techniques. ? Yoga or tai chi. ? Biofeedback, mindfulness techniques, or journaling. ? Listening to music, being out in nature, or participating in other hobbies.  Practicing a healthy lifestyle.  Eat a balanced diet, drink plenty of water, limit or avoid caffeine, and get plenty of sleep.  Having a strong support network. Spend time with family, friends, or other people you enjoy being around. Express your feelings and talk things over with someone you trust. Counseling or talk therapy with a mental health professional may be helpful if you are having trouble managing stress on your own.   Follow these instructions at home: Lifestyle  Avoid drugs.  Do not use any products that contain nicotine or tobacco, such as cigarettes, e-cigarettes, and chewing tobacco. If you need help quitting, ask your health care provider.  Limit alcohol intake to no more than 1 drink a day for nonpregnant women and 2 drinks a day for men. One drink equals 12 oz of beer, 5 oz of wine, or 1 oz of hard liquor  Do not use alcohol or drugs to relax.  Eat a balanced diet that includes fresh fruits and vegetables, whole grains, lean meats, fish, eggs, and beans, and low-fat dairy. Avoid processed foods and foods high in added fat, sugar, and salt.  Exercise at least 30 minutes on 5 or more days each week.  Get 7-8 hours of sleep each night.   General instructions  Practice stress management techniques as discussed with your health care provider.  Drink enough fluid to keep your urine clear or pale yellow.  Take over-the-counter and prescription medicines only as told by your health care provider.  Keep all follow-up visits as told by your health care provider. This is important.   Contact a health care provider if:  Your symptoms get worse.  You have new symptoms.  You feel overwhelmed by your problems and can no longer manage them on your own. Get help right away if:  You have thoughts of hurting yourself or others. If you ever feel like you may hurt yourself or others, or have thoughts about taking your own life, get help right away. You can go to your nearest emergency department or  call:  Your local emergency services (911 in the [place]  A suicide crisis helpline, such as the National Suicide Prevention Lifeline at 1-800-273-8255. This is open 24 hours a day. Summary  Stress is a normal reaction to life events. It can cause problems if it happens too often or for too long.  Practicing stress management techniques is the best way to treat stress.  Counseling or talk therapy with a mental health professional may be helpful if you are having trouble managing stress on your own. This information is not intended to replace advice given to you by your health care provider. Make sure you discuss any questions you have with your health care provider. Document Revised: 09/11/2019 Document Reviewed: 09/11/2019 Elsevier Patient Education  2021 Elsevier Inc.  

## 2020-07-12 ENCOUNTER — Telehealth: Payer: Self-pay | Admitting: Nurse Practitioner

## 2020-07-12 NOTE — Telephone Encounter (Signed)
Pt aware to drop form off

## 2020-07-13 DIAGNOSIS — Z029 Encounter for administrative examinations, unspecified: Secondary | ICD-10-CM

## 2020-08-02 ENCOUNTER — Other Ambulatory Visit: Payer: Self-pay | Admitting: Nurse Practitioner

## 2020-08-02 DIAGNOSIS — F410 Panic disorder [episodic paroxysmal anxiety] without agoraphobia: Secondary | ICD-10-CM

## 2020-08-02 DIAGNOSIS — R0989 Other specified symptoms and signs involving the circulatory and respiratory systems: Secondary | ICD-10-CM

## 2020-08-30 ENCOUNTER — Other Ambulatory Visit: Payer: Self-pay | Admitting: Nurse Practitioner

## 2020-08-30 DIAGNOSIS — F410 Panic disorder [episodic paroxysmal anxiety] without agoraphobia: Secondary | ICD-10-CM

## 2020-08-30 DIAGNOSIS — R0989 Other specified symptoms and signs involving the circulatory and respiratory systems: Secondary | ICD-10-CM

## 2020-09-02 ENCOUNTER — Other Ambulatory Visit: Payer: Self-pay | Admitting: Nurse Practitioner

## 2020-09-02 DIAGNOSIS — F411 Generalized anxiety disorder: Secondary | ICD-10-CM

## 2020-09-09 ENCOUNTER — Telehealth: Payer: Self-pay | Admitting: *Deleted

## 2020-09-09 NOTE — Telephone Encounter (Signed)
TC w/ Davina from [facility] has been trying to get the Adderall in for the last 3 days She is giving pt #19 that she has in stock instead of #60, this will void the rest of the Rx She has not been taking on a regular basis She will need another Rx for the remaining count #41

## 2020-09-13 ENCOUNTER — Other Ambulatory Visit: Payer: Self-pay | Admitting: Nurse Practitioner

## 2020-09-13 MED ORDER — AMPHETAMINE-DEXTROAMPHET ER 30 MG PO CP24: 30.0000 mg | ORAL_CAPSULE | Freq: Two times a day (BID) | ORAL | 0 refills | Status: DC

## 2020-09-13 NOTE — Telephone Encounter (Signed)
Patient has to be seen for adderall refills.

## 2020-09-13 NOTE — Telephone Encounter (Signed)
Please review

## 2020-09-27 ENCOUNTER — Other Ambulatory Visit: Payer: Self-pay | Admitting: Nurse Practitioner

## 2020-09-27 DIAGNOSIS — R0989 Other specified symptoms and signs involving the circulatory and respiratory systems: Secondary | ICD-10-CM

## 2020-09-27 DIAGNOSIS — F410 Panic disorder [episodic paroxysmal anxiety] without agoraphobia: Secondary | ICD-10-CM

## 2020-10-19 ENCOUNTER — Telehealth: Payer: Self-pay | Admitting: Nurse Practitioner

## 2020-10-19 NOTE — Telephone Encounter (Signed)
[facility] not [facility] XR 30 mg states she needs it sent to [facility] pharmacy

## 2020-10-20 ENCOUNTER — Other Ambulatory Visit: Payer: Self-pay | Admitting: Nurse Practitioner

## 2020-10-20 DIAGNOSIS — F902 Attention-deficit hyperactivity disorder, combined type: Secondary | ICD-10-CM

## 2020-10-20 MED ORDER — AMPHETAMINE-DEXTROAMPHET ER 30 MG PO CP24: 30.0000 mg | ORAL_CAPSULE | Freq: Every day | ORAL | 0 refills | Status: DC

## 2020-10-20 NOTE — Progress Notes (Unsigned)
Adderall rx sent to [facility] pharmacy

## 2020-10-20 NOTE — Progress Notes (Signed)
Attempted to contact patient - NVM 

## 2020-10-26 ENCOUNTER — Other Ambulatory Visit: Payer: Self-pay | Admitting: Nurse Practitioner

## 2020-10-26 DIAGNOSIS — F410 Panic disorder [episodic paroxysmal anxiety] without agoraphobia: Secondary | ICD-10-CM

## 2020-10-26 DIAGNOSIS — R0989 Other specified symptoms and signs involving the circulatory and respiratory systems: Secondary | ICD-10-CM

## 2020-10-31 ENCOUNTER — Ambulatory Visit: Payer: Commercial Managed Care - PPO | Admitting: Nurse Practitioner

## 2020-11-04 ENCOUNTER — Encounter: Payer: Self-pay | Admitting: Nurse Practitioner

## 2020-11-04 ENCOUNTER — Ambulatory Visit: Payer: Commercial Managed Care - PPO | Admitting: Nurse Practitioner

## 2020-11-08 ENCOUNTER — Ambulatory Visit (INDEPENDENT_AMBULATORY_CARE_PROVIDER_SITE_OTHER): Payer: Commercial Managed Care - PPO | Admitting: Nurse Practitioner

## 2020-11-08 ENCOUNTER — Encounter: Payer: Self-pay | Admitting: Nurse Practitioner

## 2020-11-08 ENCOUNTER — Other Ambulatory Visit: Payer: Self-pay

## 2020-11-08 VITALS — BP 112/77 | HR 110 | Temp 98.4°F | Resp 20 | Ht 67.0 in | Wt 149.0 lb

## 2020-11-08 DIAGNOSIS — Z23 Encounter for immunization: Secondary | ICD-10-CM | POA: Diagnosis not present

## 2020-11-08 DIAGNOSIS — F902 Attention-deficit hyperactivity disorder, combined type: Secondary | ICD-10-CM | POA: Diagnosis not present

## 2020-11-08 DIAGNOSIS — F411 Generalized anxiety disorder: Secondary | ICD-10-CM | POA: Diagnosis not present

## 2020-11-08 DIAGNOSIS — F5104 Psychophysiologic insomnia: Secondary | ICD-10-CM | POA: Diagnosis not present

## 2020-11-08 DIAGNOSIS — R5383 Other fatigue: Secondary | ICD-10-CM

## 2020-11-08 DIAGNOSIS — F331 Major depressive disorder, recurrent, moderate: Secondary | ICD-10-CM

## 2020-11-08 MED ORDER — ESCITALOPRAM OXALATE 20 MG PO TABS: 20.0000 mg | ORAL_TABLET | Freq: Every day | ORAL | 1 refills | Status: DC

## 2020-11-08 MED ORDER — TRAZODONE HCL 50 MG PO TABS: ORAL_TABLET | ORAL | 1 refills | Status: DC

## 2020-11-08 MED ORDER — AMPHETAMINE-DEXTROAMPHET ER 30 MG PO CP24: 30.0000 mg | ORAL_CAPSULE | Freq: Every day | ORAL | 0 refills | Status: DC

## 2020-11-08 MED ORDER — AMPHETAMINE-DEXTROAMPHET ER 30 MG PO CP24: 30.0000 mg | ORAL_CAPSULE | ORAL | 0 refills | Status: DC

## 2020-11-08 MED ORDER — BUSPIRONE HCL 15 MG PO TABS: 15.0000 mg | ORAL_TABLET | Freq: Two times a day (BID) | ORAL | 1 refills | Status: DC

## 2020-11-08 NOTE — Progress Notes (Signed)
Subjective:    Patient ID: Ruth Petty, female    DOB: 23-Oct-1995, 23 y.o.   MRN: [id number]   Chief Complaint: Medical Management of Chronic Issues    HPI:  1. Attention deficit hyperactivity disorder (ADHD), combined type She Is on adderall daily and is doing well. 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 No side effects from meds.  2. Moderate episode of recurrent major depressive disorder (HCC) Is on lexapro and is doing well. No sie effects form meds. Depression screen [facility] 2/9 11/08/2020 05/26/2020 11/06/2019  Decreased Interest 1 1 0  Down, Depressed, Hopeless 1 1 0  PHQ - 2 Score 2 2 0  Altered sleeping 1 1 -  Tired, decreased energy 2 2 -  Change in appetite 1 0 -  Feeling bad or failure about yourself  1 1 -  Trouble concentrating 2 0 -  Moving slowly or fidgety/restless 0 0 -  Suicidal thoughts 0 0 -  PHQ-9 Score 9 6 -  Difficult doing work/chores Not difficult at all Not difficult at all -  Some recent data might be hidden     3. Generalized anxiety disorder Is on busar as needed. GAD 7 : Generalized Anxiety Score 11/08/2020 05/26/2020 11/06/2019 04/21/2019  Nervous, Anxious, on Edge 2 1 1 1   Control/stop worrying 2 2 0 3  Worry too much - different things 2 2 3 2   Trouble relaxing 2 0 0 1  Restless 2 0 0 0  Easily annoyed or irritable 2 2 1  0  Afraid - awful might happen 2 0 0 0  Total GAD 7 Score 14 7 5 7   Anxiety Difficulty Somewhat difficult Not difficult at all Somewhat difficult Not difficult at all    4. insomnia Is on trazadone and is sleeping well.  Outpatient Encounter Medications as of 11/08/2020  Medication Sig   amphetamine-dextroamphetamine (ADDERALL XR) 30 MG 24 hr capsule Take 1 capsule (30 mg total) by mouth 2 (two) times daily.   amphetamine-dextroamphetamine (ADDERALL XR) 30 MG 24 hr capsule Take 1 capsule (30 mg total) by mouth daily. Take 1 capsule by mouth at 7am and one at 1pm daily.   busPIRone (BUSPAR) 15  MG tablet TAKE 1 TABLET BY MOUTH TWICE A DAY   escitalopram (LEXAPRO) 20 MG tablet Take 1 tablet (20 mg total) by mouth daily.   JUNEL 1.5/30 1.5-30 MG-MCG tablet Take 1 tablet by mouth daily.   Tazarotene (FABIOR) 0.1 % FOAM Apply topically at affected area 3 times a week   traZODone (DESYREL) 50 MG tablet TAKE 1 TABLET BY MOUTH EVERYDAY AT BEDTIME   tretinoin (RETIN-A) 0.025 % cream APPLY TO ACNE DAILY   amphetamine-dextroamphetamine (ADDERALL XR) 30 MG 24 hr capsule Take 1 capsule (30 mg total) by mouth every morning. Take  1 capsule at 7am and one at 1pm daily   amphetamine-dextroamphetamine (ADDERALL XR) 30 MG 24 hr capsule Take 1 capsule (30 mg total) by mouth every morning. Take  1 capsule at 7am and one at 1pm daily   [DISCONTINUED] albuterol (VENTOLIN HFA) 108 (90 Base) MCG/ACT inhaler TAKE 2 PUFFS BY MOUTH EVERY 6 HOURS AS NEEDED FOR WHEEZE OR SHORTNESS OF BREATH   No facility-administered encounter medications on file as of 11/08/2020.    History reviewed. No pertinent surgical history.  History reviewed. No pertinent family history.  New complaints: None today  Social history: Lives with mom and dad  Controlled substance contract: n/a  Review of Systems  Constitutional:  Negative for diaphoresis.  Eyes:  Negative for pain.  Respiratory:  Negative for shortness of breath.   Cardiovascular:  Negative for chest pain, palpitations and leg swelling.  Gastrointestinal:  Negative for abdominal pain.  Endocrine: Negative for polydipsia.  Skin:  Negative for rash.  Neurological:  Negative for dizziness, weakness and headaches.  Hematological:  Does not bruise/bleed easily.  All other systems reviewed and are negative.     Objective:   Physical Exam Vitals and nursing note reviewed.  Constitutional:      General: She is not in acute distress.    Appearance: Normal appearance. She is well-developed.  Neck:     Vascular: No carotid bruit or JVD.  Cardiovascular:      Rate and Rhythm: Normal rate and regular rhythm.     Heart sounds: Normal heart sounds.  Pulmonary:     Effort: Pulmonary effort is normal. No respiratory distress.     Breath sounds: Normal breath sounds. No wheezing or rales.  Chest:     Chest wall: No tenderness.  Abdominal:     General: Bowel sounds are normal. There is no distension or abdominal bruit.     Palpations: Abdomen is soft. There is no hepatomegaly, splenomegaly, mass or pulsatile mass.     Tenderness: There is no abdominal tenderness.  Musculoskeletal:        General: Normal range of motion.     Cervical back: Normal range of motion and neck supple.  Lymphadenopathy:     Cervical: No cervical adenopathy.  Skin:    General: Skin is warm and dry.  Neurological:     Mental Status: She is alert and oriented to person, place, and time.     Deep Tendon Reflexes: Reflexes are normal and symmetric.  Psychiatric:        Behavior: Behavior normal.        Thought Content: Thought content normal.        Judgment: Judgment normal.    BP 112/77   Pulse (!) 110   Temp 98.4 F (36.9 C) (Temporal)   Resp 20   Ht 5\' 7"  (1.702 m)   Wt 149 lb (67.6 kg)   BMI 23.34 kg/m        Assessment & Plan:   ESSENCE MERLE comes in today with chief complaint of Medical Management of Chronic Issues   Diagnosis and orders addressed:  1. Attention deficit hyperactivity disorder (ADHD), combined type Stress management - amphetamine-dextroamphetamine (ADDERALL XR) 30 MG 24 hr capsule; Take 1 capsule (30 mg total) by mouth daily. Take 1 capsule by mouth at 7am and one at 1pm daily.  Dispense: 60 capsule; Refill: 0 - amphetamine-dextroamphetamine (ADDERALL XR) 30 MG 24 hr capsule; Take 1 capsule (30 mg total) by mouth every morning. Take  1 capsule at 7am and one at 1pm daily  Dispense: 60 capsule; Refill: 0 - amphetamine-dextroamphetamine (ADDERALL XR) 30 MG 24 hr capsule; Take 1 capsule (30 mg total) by mouth every morning. Take  1  capsule at 7am and one at 1pm daily  Dispense: 60 capsule; Refill: 0  2. Moderate episode of recurrent major depressive disorder (HCC) - escitalopram (LEXAPRO) 20 MG tablet; Take 1 tablet (20 mg total) by mouth daily.  Dispense: 90 tablet; Refill: 1  3. Generalized anxiety disorder - busPIRone (BUSPAR) 15 MG tablet; Take 1 tablet (15 mg total) by mouth 2 (two) times daily.  Dispense: 180 tablet; Refill: 1  4.  Psychophysiological insomnia Bedtime routine - traZODone (DESYREL) 50 MG tablet; TAKE 1 TABLET BY MOUTH EVERYDAY AT BEDTIME  Dispense: 90 tablet; Refill: 1   Labs pending Health Maintenance reviewed Diet and exercise encouraged  Follow up plan: 3 months   Mary-Margaret Daphine Deutscher, FNP

## 2020-11-18 ENCOUNTER — Other Ambulatory Visit: Payer: Self-pay | Admitting: Nurse Practitioner

## 2020-11-18 DIAGNOSIS — R0989 Other specified symptoms and signs involving the circulatory and respiratory systems: Secondary | ICD-10-CM

## 2020-11-18 DIAGNOSIS — F410 Panic disorder [episodic paroxysmal anxiety] without agoraphobia: Secondary | ICD-10-CM

## 2020-11-30 ENCOUNTER — Other Ambulatory Visit: Payer: Self-pay | Admitting: Nurse Practitioner

## 2020-11-30 DIAGNOSIS — F5104 Psychophysiologic insomnia: Secondary | ICD-10-CM

## 2020-12-04 ENCOUNTER — Other Ambulatory Visit: Payer: Self-pay | Admitting: Nurse Practitioner

## 2020-12-04 DIAGNOSIS — F411 Generalized anxiety disorder: Secondary | ICD-10-CM

## 2020-12-30 ENCOUNTER — Encounter: Payer: Self-pay | Admitting: Nurse Practitioner

## 2020-12-30 ENCOUNTER — Ambulatory Visit (INDEPENDENT_AMBULATORY_CARE_PROVIDER_SITE_OTHER): Payer: Commercial Managed Care - PPO | Admitting: Nurse Practitioner

## 2020-12-30 VITALS — BP 132/82 | HR 103 | Temp 98.1°F | Resp 20 | Ht 67.0 in | Wt 149.0 lb

## 2020-12-30 DIAGNOSIS — J020 Streptococcal pharyngitis: Secondary | ICD-10-CM | POA: Diagnosis not present

## 2020-12-30 DIAGNOSIS — R52 Pain, unspecified: Secondary | ICD-10-CM

## 2020-12-30 DIAGNOSIS — J029 Acute pharyngitis, unspecified: Secondary | ICD-10-CM

## 2020-12-30 LAB — VERITOR FLU A/B WAIVED
Influenza A: NEGATIVE
Influenza B: NEGATIVE

## 2020-12-30 LAB — RAPID STREP SCREEN (MED CTR MEBANE ONLY): Strep Gp A Ag, IA W/Reflex: POSITIVE — AB

## 2020-12-30 MED ORDER — AMOXICILLIN 875 MG PO TABS: 875.0000 mg | ORAL_TABLET | Freq: Two times a day (BID) | ORAL | 0 refills | Status: DC

## 2020-12-30 NOTE — Patient Instructions (Addendum)
Earwax Buildup, Adult ?The ears produce a substance called earwax that helps keep bacteria out of the ear and protects the skin in the ear canal. Occasionally, earwax can build up in the ear and cause discomfort or hearing loss. ?What are the causes? ?This condition is caused by a buildup of earwax. Ear canals are self-cleaning. Ear wax is made in the outer part of the ear canal and generally falls out in small amounts over time. ?When the self-cleaning mechanism is not working, earwax builds up and can cause decreased hearing and discomfort. Attempting to clean ears with cotton swabs can push the earwax deep into the ear canal and cause decreased hearing and pain. ?What increases the risk? ?This condition is more likely to develop in people who: ?Clean their ears often with cotton swabs. ?Pick at their ears. ?Use earplugs or in-ear headphones often, or wear hearing aids. ?The following factors may also make you more likely to develop this condition: ?Being female. ?Being of older age. ?Naturally producing more earwax. ?Having narrow ear canals. ?Having earwax that is overly thick or sticky. ?Having excess hair in the ear canal. ?Having eczema. ?Being dehydrated. ?What are the signs or symptoms? ?Symptoms of this condition include: ?Reduced or muffled hearing. ?A feeling of fullness in the ear or feeling that the ear is plugged. ?Fluid coming from the ear. ?Ear pain or an itchy ear. ?Ringing in the ear. ?Coughing. ?Balance problems. ?An obvious piece of earwax that can be seen inside the ear canal. ?How is this diagnosed? ?This condition may be diagnosed based on: ?Your symptoms. ?Your medical history. ?An ear exam. During the exam, your health care provider will look into your ear with an instrument called an otoscope. ?You may have tests, including a hearing test. ?How is this treated? ?This condition may be treated by: ?Using ear drops to soften the earwax. ?Having the earwax removed by a health care provider. The  health care provider may: ?Flush the ear with water. ?Use an instrument that has a loop on the end (curette). ?Use a suction device. ?Having surgery to remove the wax buildup. This may be done in severe cases. ?Follow these instructions at home: ? ?Take over-the-counter and prescription medicines only as told by your health care provider. ?Do not put any objects, including cotton swabs, into your ear. You can clean the opening of your ear canal with a washcloth or facial tissue. ?Follow instructions from your health care provider about cleaning your ears. Do not overclean your ears. ?Drink enough fluid to keep your urine pale yellow. This will help to thin the earwax. ?Keep all follow-up visits as told. If earwax builds up in your ears often or if you use hearing aids, consider seeing your health care provider for routine, preventive ear cleanings. Ask your health care provider how often you should schedule your cleanings. ?If you have hearing aids, clean them according to instructions from the manufacturer and your health care provider. ?Contact a health care provider if: ?You have ear pain. ?You develop a fever. ?You have pus or other fluid coming from your ear. ?You have hearing loss. ?You have ringing in your ears that does not go away. ?You feel like the room is spinning (vertigo). ?Your symptoms do not improve with treatment. ?Get help right away if: ?You have bleeding from the affected ear. ?You have severe ear pain. ?Summary ?Earwax can build up in the ear and cause discomfort or hearing loss. ?The most common symptoms of this condition include   reduced or muffled hearing, a feeling of fullness in the ear, or feeling that the ear is plugged. ?This condition may be diagnosed based on your symptoms, your medical history, and an ear exam. ?This condition may be treated by using ear drops to soften the earwax or by having the earwax removed by a health care provider. ?Do not put any objects, including cotton  swabs, into your ear. You can clean the opening of your ear canal with a washcloth or facial tissue. ?This information is not intended to replace advice given to you by your health care provider. Make sure you discuss any questions you have with your health care provider. ?Document Revised: 04/14/2019 Document Reviewed: 04/14/2019 ?Elsevier Patient Education ? 2022 Elsevier Inc. ? ?

## 2020-12-30 NOTE — Progress Notes (Signed)
° °  Subjective:    Patient ID: Ruth Petty, female    DOB: 09-17-1995, 25 y.o.   MRN: [id number]   Chief Complaint: Throat feels swollen   HPI [place] and felt sick the first day she was there. She still has slight osre throat. Wants to be checked for flu and strep. She actually feels better now.    Review of Systems  Constitutional:  Negative for chills and fever.  HENT:  Positive for congestion and rhinorrhea. Negative for sneezing.   Respiratory: Negative.    Cardiovascular: Negative.   Genitourinary: Negative.   Neurological: Negative.   Psychiatric/Behavioral: Negative.        Objective:   Physical Exam Vitals reviewed.  Constitutional:      Appearance: Normal appearance.  HENT:     Right Ear: Tympanic membrane normal. There is impacted cerumen.     Left Ear: Tympanic membrane normal. There is impacted cerumen.     Nose: Congestion and rhinorrhea present.     Mouth/Throat:     Mouth: Mucous membranes are moist.     Pharynx: Posterior oropharyngeal erythema present.  Cardiovascular:     Rate and Rhythm: Normal rate and regular rhythm.     Heart sounds: Normal heart sounds.  Pulmonary:     Effort: Pulmonary effort is normal.     Breath sounds: Normal breath sounds.  Musculoskeletal:     Cervical back: Normal range of motion and neck supple.  Skin:    General: Skin is warm.  Neurological:     General: No focal deficit present.     Mental Status: She is alert.   BP 132/82    Pulse (!) 103    Temp 98.1 F (36.7 C) (Temporal)    Resp 20    Ht 5\' 7"  (1.702 m)    Wt 149 lb (67.6 kg)    BMI 23.34 kg/m   Status post ear lavage  Strep positive     Assessment & Plan:   Ruth Petty in today with chief complaint of Throat feels swollen   1. Body aches - Veritor Flu A/B Waived  2. Sore throat Force fluids Motrin or tylenol OTC OTC decongestant Throat lozenges if help New toothbrush in 3 days  - Rapid Strep Screen ([facility] ONLY)  3. Strep pharyngitis - amoxicillin (AMOXIL) 875 MG tablet; Take 1 tablet (875 mg total) by mouth 2 (two) times daily. 1 po BID  Dispense: 20 tablet; Refill: 0  4. Cerumen impaction Debrox 2-3x a week  The above assessment and management plan was discussed with the patient. The patient verbalized understanding of and has agreed to the management plan. Patient is aware to call the clinic if symptoms persist or worsen. Patient is aware when to return to the clinic for a follow-up visit. Patient educated on when it is appropriate to go to the emergency department.   Mary-Margaret Jackqulyn Livings, FNP

## 2021-01-17 ENCOUNTER — Ambulatory Visit (INDEPENDENT_AMBULATORY_CARE_PROVIDER_SITE_OTHER): Payer: Commercial Managed Care - PPO | Admitting: Nurse Practitioner

## 2021-01-17 ENCOUNTER — Other Ambulatory Visit: Payer: Self-pay

## 2021-01-17 ENCOUNTER — Encounter: Payer: Self-pay | Admitting: Nurse Practitioner

## 2021-01-17 VITALS — BP 113/74 | HR 89 | Temp 98.1°F | Resp 20 | Ht 67.0 in | Wt 152.0 lb

## 2021-01-17 DIAGNOSIS — L989 Disorder of the skin and subcutaneous tissue, unspecified: Secondary | ICD-10-CM

## 2021-01-17 MED ORDER — SULFAMETHOXAZOLE-TRIMETHOPRIM 800-160 MG PO TABS: 1.0000 | ORAL_TABLET | Freq: Two times a day (BID) | ORAL | 0 refills | Status: DC

## 2021-01-17 NOTE — Progress Notes (Signed)
Subjective:    Patient ID: Ruth Petty, female    DOB: 11/21/1995, 26 y.o.   MRN: [id number]   Chief Complaint: Red spot on left leg (4 weeks/)   HPI Has a nonhealing area on left shin. Has been there for 4 weeks. She has not been picking at it. Has been using antibiotic ointment and no better.     Review of Systems  Constitutional:  Negative for diaphoresis.  Eyes:  Negative for pain.  Respiratory:  Negative for shortness of breath.   Cardiovascular:  Negative for chest pain, palpitations and leg swelling.  Gastrointestinal:  Negative for abdominal pain.  Endocrine: Negative for polydipsia.  Skin:  Negative for rash.  Neurological:  Negative for dizziness, weakness and headaches.  Hematological:  Does not bruise/bleed easily.  All other systems reviewed and are negative.     Objective:   Physical Exam Vitals and nursing note reviewed.  Constitutional:      General: She is not in acute distress.    Appearance: Normal appearance. She is well-developed.  Neck:     Vascular: No carotid bruit or JVD.  Cardiovascular:     Rate and Rhythm: Normal rate and regular rhythm.     Heart sounds: Normal heart sounds.  Pulmonary:     Effort: Pulmonary effort is normal. No respiratory distress.     Breath sounds: Normal breath sounds. No wheezing or rales.  Chest:     Chest wall: No tenderness.  Abdominal:     General: Bowel sounds are normal. There is no distension or abdominal bruit.     Palpations: Abdomen is soft. There is no hepatomegaly, splenomegaly, mass or pulsatile mass.     Tenderness: There is no abdominal tenderness.  Musculoskeletal:        General: Normal range of motion.     Cervical back: Normal range of motion and neck supple.  Lymphadenopathy:     Cervical: No cervical adenopathy.  Skin:    General: Skin is warm and dry.     Comments: 3cm annular erythematous raised lesion on left lower leg  Neurological:     Mental Status: She is alert and oriented to  person, place, and time.     Deep Tendon Reflexes: Reflexes are normal and symmetric.  Psychiatric:        Behavior: Behavior normal.        Thought Content: Thought content normal.        Judgment: Judgment normal.   BP 113/74    Pulse 89    Temp 98.1 F (36.7 C) (Temporal)    Resp 20    Ht 5\' 7"  (1.702 m)    Wt 152 lb (68.9 kg)    SpO2 99%    BMI 23.81 kg/m          Assessment & Plan:  in today with chief complaint of Red spot on left leg (4 weeks/)   1. Skin lesion of left leg Warm compresses Do no pick or scratch If not improving will refer to derm - sulfamethoxazole-trimethoprim (BACTRIM DS) 800-160 MG tablet; Take 1 tablet by mouth 2 (two) times daily.  Dispense: 20 tablet; Refill: 0    The above assessment and management plan was discussed with the patient. The patient verbalized understanding of and has agreed to the management plan. Patient is aware to call the clinic if symptoms persist or worsen. Patient is aware when to return to the clinic for a follow-up visit.  Patient educated on when it is appropriate to go to the emergency department.   Mary-Margaret Daphine Deutscher, FNP

## 2021-01-24 ENCOUNTER — Ambulatory Visit: Payer: Self-pay | Admitting: Physician Assistant

## 2021-01-25 ENCOUNTER — Other Ambulatory Visit: Payer: Self-pay

## 2021-01-25 ENCOUNTER — Encounter: Payer: Self-pay | Admitting: Dermatology

## 2021-01-25 ENCOUNTER — Ambulatory Visit (INDEPENDENT_AMBULATORY_CARE_PROVIDER_SITE_OTHER): Payer: Commercial Managed Care - PPO | Admitting: Dermatology

## 2021-01-25 DIAGNOSIS — L729 Follicular cyst of the skin and subcutaneous tissue, unspecified: Secondary | ICD-10-CM

## 2021-01-25 DIAGNOSIS — L700 Acne vulgaris: Secondary | ICD-10-CM | POA: Diagnosis not present

## 2021-01-25 MED ORDER — TRETINOIN 0.025 % EX CREA: TOPICAL_CREAM | Freq: Every day | CUTANEOUS | 0 refills | Status: AC

## 2021-01-25 MED ORDER — TAZAROTENE 0.1 % EX FOAM
1.0000 | Freq: Every day | CUTANEOUS | 6 refills | Status: AC
Start: 2021-01-25 — End: ?

## 2021-01-25 MED ORDER — MUPIROCIN 2 % EX OINT: TOPICAL_OINTMENT | CUTANEOUS | 0 refills | Status: DC

## 2021-01-25 NOTE — Patient Instructions (Addendum)
Apply mupirocin ointment  to superficial cyst daily

## 2021-01-27 ENCOUNTER — Telehealth (INDEPENDENT_AMBULATORY_CARE_PROVIDER_SITE_OTHER): Payer: Commercial Managed Care - PPO | Admitting: Dermatology

## 2021-01-27 NOTE — Telephone Encounter (Signed)
Ruth Petty's mother contacted me by phone that the prescription for the spot on her left leg had not been sent in.  I called [facility] in [place] and left a voicemail for mupirocin 1 ounce with 1 refill to be applied to her leg twice daily for up to 2 weeks.

## 2021-01-31 LAB — ANAEROBIC AND AEROBIC CULTURE
AER RESULT:: NO GROWTH
GRAM STAIN:: NONE SEEN
MICRO NUMBER:: 12886240
MICRO NUMBER:: 12886241
SPECIMEN QUALITY:: ADEQUATE
SPECIMEN QUALITY:: ADEQUATE

## 2021-02-09 ENCOUNTER — Ambulatory Visit: Payer: Commercial Managed Care - PPO | Admitting: Nurse Practitioner

## 2021-02-10 ENCOUNTER — Encounter: Payer: Self-pay | Admitting: Nurse Practitioner

## 2021-02-17 ENCOUNTER — Encounter: Payer: Self-pay | Admitting: Dermatology

## 2021-02-17 NOTE — Progress Notes (Signed)
° °  Follow-Up Visit   Subjective  Ruth Petty is a 26 y.o. female who presents for the following: Skin Problem (Left shin x 2 months per patient ingrown hair, also she stated x2 rounds of antibiotic).  Inflamed spot on left shin Location:  Duration:  Quality:  Associated Signs/Symptoms: Modifying Factors:  Severity:  Timing: Context:   Objective  Well appearing patient in no apparent distress; mood and affect are within normal limits. Left Lower Leg - Anterior Patient relates a history of a swollen tender spot on her left shin which drained by solid material after manual manipulation.  She felt it might have begun with an ingrown hair perhaps related to shaving.  Primary care physician placed her on oral antibiotics seems to be flatter but discolored now.  Examination showed a nontender red-brown 9 mm macule with no fluctuance.  Head Acne generally under control with topical therapy.   Ok to refill per ST the acne medications    A focused examination was performed including head, neck, legs. Relevant physical exam findings are noted in the Assessment and Plan.   Assessment & Plan    Cyst of skin Left Lower Leg - Anterior  Stop all antibiotics by mouth and start the mupirocin daily for 2 weeks.  Mother with baby throughout visit.  Both were told of the likelihood of discoloration possible scar after healing.  Related Procedures Anaerobic and Aerobic Culture  Related Medications mupirocin ointment (BACTROBAN) 2 % Apply to superficial cyst daily  Acne vulgaris Head  Prescription printed and given to patient   tretinoin (RETIN-A) 0.025 % cream - Head Apply topically at bedtime.  Tazarotene (FABIOR) 0.1 % FOAM - Head Apply 1 application topically daily.      I, Janalyn Harder, MD, have reviewed all documentation for this visit.  The documentation on 02/17/21 for the exam, diagnosis, procedures, and orders are all accurate and complete.

## 2021-03-28 ENCOUNTER — Ambulatory Visit (INDEPENDENT_AMBULATORY_CARE_PROVIDER_SITE_OTHER): Payer: Commercial Managed Care - PPO | Admitting: Nurse Practitioner

## 2021-03-28 ENCOUNTER — Encounter: Payer: Self-pay | Admitting: Nurse Practitioner

## 2021-03-28 ENCOUNTER — Telehealth: Payer: Self-pay | Admitting: Nurse Practitioner

## 2021-03-28 VITALS — BP 113/75 | HR 113 | Temp 98.7°F | Ht 67.0 in | Wt 152.0 lb

## 2021-03-28 DIAGNOSIS — J029 Acute pharyngitis, unspecified: Secondary | ICD-10-CM | POA: Diagnosis not present

## 2021-03-28 LAB — RAPID STREP SCREEN (MED CTR MEBANE ONLY): Strep Gp A Ag, IA W/Reflex: NEGATIVE

## 2021-03-28 LAB — CULTURE, GROUP A STREP

## 2021-03-28 NOTE — Progress Notes (Signed)
? ?Acute Office Visit ? ?Subjective:  ? ? Patient ID: Ruth Petty, female    DOB: 04-Oct-1995, 26 y.o.   MRN: [id number] ? ?Chief Complaint  ?Patient presents with  ? Sore Throat  ? ? ?Sore Throat  ?This is a new problem. The current episode started yesterday. The problem has been unchanged. Neither side of throat is experiencing more pain than the other. There has been no fever. The fever has been present for Less than 1 day. The pain is at a severity of 2/10. The pain is mild. Pertinent negatives include no abdominal pain or congestion.  ? ? ?Past Medical History:  ?Diagnosis Date  ? ADHD (attention deficit hyperactivity disorder)   ? ? ?History reviewed. No pertinent surgical history. ? ?History reviewed. No pertinent family history. ? ?Social History  ? ?Socioeconomic History  ? Marital status: Single  ?  Spouse name: Not on file  ? Number of children: Not on file  ? Years of education: Not on file  ? Highest education level: Not on file  ?Occupational History  ? Not on file  ?Tobacco Use  ? Smoking status: Never  ? Smokeless tobacco: Never  ?Vaping Use  ? Vaping Use: Never used  ?Substance and Sexual Activity  ? Alcohol use: No  ? Drug use: No  ? Sexual activity: Not on file  ?Other Topics Concern  ? Not on file  ?Social History Narrative  ? Not on file  ? ?Social Determinants of Health  ? ?Financial Resource Strain: Not on file  ?Food Insecurity: Not on file  ?Transportation Needs: Not on file  ?Physical Activity: Not on file  ?Stress: Not on file  ?Social Connections: Not on file  ?Intimate Partner Violence: Not on file  ? ? ?Outpatient Medications Prior to Visit  ?Medication Sig Dispense Refill  ? busPIRone (BUSPAR) 15 MG tablet TAKE 1 TABLET BY MOUTH TWICE A DAY 180 tablet 0  ? escitalopram (LEXAPRO) 20 MG tablet Take 1 tablet (20 mg total) by mouth daily. 90 tablet 1  ? JUNEL 1.5/30 1.5-30 MG-MCG tablet Take 1 tablet by mouth daily.  4  ? mupirocin ointment (BACTROBAN) 2 % Apply to superficial cyst  daily 22 g 0  ? sulfamethoxazole-trimethoprim (BACTRIM DS) 800-160 MG tablet Take 1 tablet by mouth 2 (two) times daily. 20 tablet 0  ? Tazarotene (FABIOR) 0.1 % FOAM Apply 1 application topically daily. 50 g 6  ? traZODone (DESYREL) 50 MG tablet TAKE 1 TABLET BY MOUTH EVERYDAY AT BEDTIME 90 tablet 1  ? tretinoin (RETIN-A) 0.025 % cream Apply topically at bedtime. 45 g 0  ? amphetamine-dextroamphetamine (ADDERALL XR) 30 MG 24 hr capsule Take 1 capsule (30 mg total) by mouth daily. Take 1 capsule by mouth at 7am and one at 1pm daily. 60 capsule 0  ? amphetamine-dextroamphetamine (ADDERALL XR) 30 MG 24 hr capsule Take 1 capsule (30 mg total) by mouth every morning. Take  1 capsule at 7am and one at 1pm daily 60 capsule 0  ? amphetamine-dextroamphetamine (ADDERALL XR) 30 MG 24 hr capsule Take 1 capsule (30 mg total) by mouth every morning. Take  1 capsule at 7am and one at 1pm daily 60 capsule 0  ? ?No facility-administered medications prior to visit.  ? ? ?No Known Allergies ? ?Review of Systems  ?HENT:  Positive for sore throat. Negative for congestion.   ?Eyes: Negative.   ?Respiratory: Negative.    ?Cardiovascular: Negative.   ?Gastrointestinal:  Negative for abdominal pain.  ?  All other systems reviewed and are negative. ? ?   ?Objective:  ?  ?Physical Exam ?Vitals reviewed.  ?Constitutional:   ?   Appearance: Normal appearance. She is well-developed.  ?HENT:  ?   Head: Normocephalic.  ?   Right Ear: Ear canal and external ear normal.  ?   Left Ear: Ear canal and external ear normal.  ?   Nose: Nose normal. No congestion.  ?   Mouth/Throat:  ?   Mouth: Mucous membranes are moist.  ?   Pharynx: Uvula midline. Posterior oropharyngeal erythema present.  ?Eyes:  ?   Conjunctiva/sclera: Conjunctivae normal.  ?Cardiovascular:  ?   Rate and Rhythm: Normal rate and regular rhythm.  ?   Pulses: Normal pulses.  ?   Heart sounds: Normal heart sounds.  ?Pulmonary:  ?   Effort: Pulmonary effort is normal.  ?   Breath sounds:  Normal breath sounds.  ?Abdominal:  ?   General: Bowel sounds are normal.  ?Musculoskeletal:     ?   General: Normal range of motion.  ?Skin: ?   Findings: No rash.  ?Neurological:  ?   Mental Status: She is alert and oriented to person, place, and time.  ?Psychiatric:     ?   Mood and Affect: Mood normal.     ?   Behavior: Behavior normal.  ? ? ?BP 113/75   Pulse (!) 113   Temp 98.7 ?F (37.1 ?C)   Ht 5\' 7"  (1.702 m)   Wt 152 lb (68.9 kg)   LMP 03/21/2021 (Approximate)   SpO2 100%   BMI 23.81 kg/m?  ?Wt Readings from Last 3 Encounters:  ?03/28/21 152 lb (68.9 kg)  ?01/17/21 152 lb (68.9 kg)  ?12/30/20 149 lb (67.6 kg)  ? ? ?Health Maintenance Due  ?Topic Date Due  ? PAP-Cervical Cytology Screening  Never done  ? PAP SMEAR-Modifier  Never done  ? COVID-19 Vaccine (2 - Moderna series) 10/13/2019  ? ? ?There are no preventive care reminders to display for this patient. ? ? ?No results found for: TSH ?Lab Results  ?Component Value Date  ? HGB 14.9 04/16/2013  ? ?   ?Assessment & Plan:  ?Take meds as prescribed ?- Use a cool mist humidifier  ?-Use saline nose sprays frequently ?-Force fluids ?-For fever or aches or pains- take Tylenol or ibuprofen. ?-salt water gargle ?-strep swab completed results pending ?-If symptoms do not improve, she may need to be COVID tested to rule this out ?Follow up with worsening unresolved symptoms  ?Problem List Items Addressed This Visit   ?None ?Visit Diagnoses   ? ? Sore throat    -  Primary  ? Relevant Orders  ? Rapid Strep Screen ([facility] ONLY)  ? ?  ? ? ? ?No orders of the defined types were placed in this encounter. ? ? ? ?06/16/2013, NP ? ?

## 2021-03-28 NOTE — Patient Instructions (Signed)
Sore Throat When you have a sore throat, your throat may feel: Tender. Burning. Irritated. Scratchy. Painful when you swallow. Painful when you talk. Many things can cause a sore throat, such as: An infection. Allergies. Dry air. Smoke or pollution. Radiation treatment for cancer. Gastroesophageal reflux disease (GERD). A tumor. A sore throat can be the first sign of another sickness. It can happen with other problems, like: Coughing. Sneezing. Fever. Swelling of the glands in the neck. Most sore throats go away without treatment. Follow these instructions at home:   Medicines Take over-the-counter and prescription medicines only as told by your doctor. Children often get sore throats. Do not give your child aspirin. Use throat sprays to soothe your throat as told by your health care provider. Managing pain To help with pain: Sip warm liquids, such as broth, herbal tea, or warm water. Eat or drink cold or frozen liquids, such as frozen ice pops. Rinse your mouth (gargle) with a salt water mixture 3-4 times a day or as needed. To make salt water, dissolve -1 tsp (3-6 g) of salt in 1 cup (237 mL) of warm water. Do not swallow this mixture. Suck on hard candy or throat lozenges. Put a cool-mist humidifier in your bedroom at night. Sit in the bathroom with the door closed for 5-10 minutes while you run hot water in the shower. General instructions Do not smoke or use any products that contain nicotine or tobacco. If you need help quitting, ask your doctor. Get plenty of rest. Drink enough fluid to keep your pee (urine) pale yellow. Wash your hands often for at least 20 seconds with soap and water. If soap and water are not available, use hand sanitizer. Contact a doctor if: You have a fever for more than 2-3 days. You keep having symptoms for more than 2-3 days. Your throat does not get better in 7 days. You have a fever and your symptoms suddenly get worse. Your child  who is 3 months to 3 years old has a temperature of 102.2F (39C) or higher. Get help right away if: You have trouble breathing. You cannot swallow fluids, soft foods, or your spit. You have swelling in your throat or neck that gets worse. You feel like you may vomit (nauseous) and this feeling lasts a long time. You cannot stop vomiting. These symptoms may be an emergency. Get help right away. Call your local emergency services (911 in the [place] Do not wait to see if the symptoms will go away. Do not drive yourself to the hospital. Summary A sore throat is a painful, burning, irritated, or scratchy throat. Many things can cause a sore throat. Take over-the-counter medicines only as told by your doctor. Get plenty of rest. Drink enough fluid to keep your pee (urine) pale yellow. Contact a doctor if your symptoms get worse or your sore throat does not get better within 7 days. This information is not intended to replace advice given to you by your health care provider. Make sure you discuss any questions you have with your health care provider. Document Revised: 03/23/2020 Document Reviewed: 03/23/2020 Elsevier Patient Education  2022 Elsevier Inc.  

## 2021-03-29 NOTE — Telephone Encounter (Signed)
Left detailed message per signed DPR ?

## 2021-03-31 ENCOUNTER — Ambulatory Visit (INDEPENDENT_AMBULATORY_CARE_PROVIDER_SITE_OTHER): Payer: Commercial Managed Care - PPO | Admitting: Family Medicine

## 2021-03-31 ENCOUNTER — Encounter: Payer: Self-pay | Admitting: Family Medicine

## 2021-03-31 VITALS — BP 107/76 | HR 118 | Temp 98.3°F | Ht 67.0 in | Wt 153.4 lb

## 2021-03-31 DIAGNOSIS — R0981 Nasal congestion: Secondary | ICD-10-CM | POA: Diagnosis not present

## 2021-03-31 DIAGNOSIS — H60502 Unspecified acute noninfective otitis externa, left ear: Secondary | ICD-10-CM

## 2021-03-31 MED ORDER — CIPROFLOXACIN-DEXAMETHASONE 0.3-0.1 % OT SUSP: 4.0000 [drp] | Freq: Two times a day (BID) | OTIC | 0 refills | Status: AC

## 2021-03-31 NOTE — Progress Notes (Signed)
? ?Acute Office Visit ? ?Subjective:  ? ? Patient ID: Ruth Petty, female    DOB: 08/01/1995, 26 y.o.   MRN: [id number] ? ?Chief Complaint  ?Patient presents with  ? Sinusitis  ? ? ?HPI ?Patient is in today for congestion and sinus drainage x4 days. She has been having ear pain x 2 days. She reports popping and pressure in her ears. Her right ear to tender to the touch. She was seen a few days again and had a negative strep and Covid test. She has been taking claritin for her symptoms. She denies fever, drainage, chills, body aches, hearing loss, chest pain, or shortness of breath.  ? ? ?Past Medical History:  ?Diagnosis Date  ? ADHD (attention deficit hyperactivity disorder)   ? ? ?No past surgical history on file. ? ?No family history on file. ? ?Social History  ? ?Socioeconomic History  ? Marital status: Single  ?  Spouse name: Not on file  ? Number of children: Not on file  ? Years of education: Not on file  ? Highest education level: Not on file  ?Occupational History  ? Not on file  ?Tobacco Use  ? Smoking status: Never  ? Smokeless tobacco: Never  ?Vaping Use  ? Vaping Use: Never used  ?Substance and Sexual Activity  ? Alcohol use: No  ? Drug use: No  ? Sexual activity: Not on file  ?Other Topics Concern  ? Not on file  ?Social History Narrative  ? Not on file  ? ?Social Determinants of Health  ? ?Financial Resource Strain: Not on file  ?Food Insecurity: Not on file  ?Transportation Needs: Not on file  ?Physical Activity: Not on file  ?Stress: Not on file  ?Social Connections: Not on file  ?Intimate Partner Violence: Not on file  ? ? ?Outpatient Medications Prior to Visit  ?Medication Sig Dispense Refill  ? busPIRone (BUSPAR) 15 MG tablet TAKE 1 TABLET BY MOUTH TWICE A DAY 180 tablet 0  ? escitalopram (LEXAPRO) 20 MG tablet Take 1 tablet (20 mg total) by mouth daily. 90 tablet 1  ? JUNEL 1.5/30 1.5-30 MG-MCG tablet Take 1 tablet by mouth daily.  4  ? loratadine (CLARITIN) 10 MG tablet Take 10 mg by mouth  daily.    ? mupirocin ointment (BACTROBAN) 2 % Apply to superficial cyst daily 22 g 0  ? Tazarotene (FABIOR) 0.1 % FOAM Apply 1 application topically daily. 50 g 6  ? traZODone (DESYREL) 50 MG tablet TAKE 1 TABLET BY MOUTH EVERYDAY AT BEDTIME 90 tablet 1  ? tretinoin (RETIN-A) 0.025 % cream Apply topically at bedtime. 45 g 0  ? amphetamine-dextroamphetamine (ADDERALL XR) 30 MG 24 hr capsule Take 1 capsule (30 mg total) by mouth daily. Take 1 capsule by mouth at 7am and one at 1pm daily. 60 capsule 0  ? amphetamine-dextroamphetamine (ADDERALL XR) 30 MG 24 hr capsule Take 1 capsule (30 mg total) by mouth every morning. Take  1 capsule at 7am and one at 1pm daily 60 capsule 0  ? amphetamine-dextroamphetamine (ADDERALL XR) 30 MG 24 hr capsule Take 1 capsule (30 mg total) by mouth every morning. Take  1 capsule at 7am and one at 1pm daily 60 capsule 0  ? sulfamethoxazole-trimethoprim (BACTRIM DS) 800-160 MG tablet Take 1 tablet by mouth 2 (two) times daily. 20 tablet 0  ? ?No facility-administered medications prior to visit.  ? ? ?No Known Allergies ? ?Review of Systems ?As per HPI.  ?   ?  Objective:  ?  ?Physical Exam ?Vitals and nursing note reviewed.  ?Constitutional:   ?   General: She is not in acute distress. ?   Appearance: She is not ill-appearing, toxic-appearing or diaphoretic.  ?HENT:  ?   Head: Normocephalic and atraumatic.  ?   Right Ear: Tympanic membrane normal. No drainage. No mastoid tenderness.  ?   Left Ear: Tympanic membrane normal. Swelling (canal) and tenderness (tragus, canal) present. No drainage. No mastoid tenderness.  ?   Nose: Congestion present.  ?   Right Sinus: No maxillary sinus tenderness or frontal sinus tenderness.  ?   Left Sinus: No maxillary sinus tenderness or frontal sinus tenderness.  ?   Mouth/Throat:  ?   Pharynx: Oropharynx is clear. No pharyngeal swelling.  ?   Tonsils: No tonsillar exudate or tonsillar abscesses.  ?Cardiovascular:  ?   Pulses: Normal pulses.  ?   Heart sounds:  Normal heart sounds. No murmur heard. ?Pulmonary:  ?   Effort: Pulmonary effort is normal. No respiratory distress.  ?   Breath sounds: Normal breath sounds.  ?Musculoskeletal:  ?   Right lower leg: No edema.  ?   Left lower leg: No edema.  ?Skin: ?   General: Skin is warm and dry.  ?Neurological:  ?   Mental Status: She is alert and oriented to person, place, and time.  ?Psychiatric:     ?   Mood and Affect: Mood normal.     ?   Behavior: Behavior normal.  ? ? ?BP 107/76   Pulse (!) 118   Temp 98.3 ?F (36.8 ?C) (Temporal)   Ht 5' 7" (1.702 m)   Wt 153 lb 6 oz (69.6 kg)   LMP 03/21/2021 (Approximate)   BMI 24.02 kg/m?  ?Wt Readings from Last 3 Encounters:  ?03/31/21 153 lb 6 oz (69.6 kg)  ?03/28/21 152 lb (68.9 kg)  ?01/17/21 152 lb (68.9 kg)  ? ? ?Health Maintenance Due  ?Topic Date Due  ? PAP-Cervical Cytology Screening  Never done  ? PAP SMEAR-Modifier  Never done  ? COVID-19 Vaccine (2 - Moderna series) 10/13/2019  ? ? ?There are no preventive care reminders to display for this patient. ? ? ?No results found for: TSH ?Lab Results  ?Component Value Date  ? HGB 14.9 04/16/2013  ? ?No results found for: NA, K, CHLORIDE, CO2, GLUCOSE, BUN, CREATININE, BILITOT, ALKPHOS, AST, ALT, PROT, ALBUMIN, CALCIUM, ANIONGAP, EGFR, GFR ?No results found for: CHOL ?No results found for: HDL ?No results found for: [place] ?No results found for: TRIG ?No results found for: CHOLHDL ?No results found for: HGBA1C ? ?   ?Assessment & Plan:  ? ?Ruth Petty was seen today for sinusitis. ? ?Diagnoses and all orders for this visit: ? ?Nasal congestion ?Discussed symptomatic care.  ? ?Acute otitis externa of left ear, unspecified type ?Ciprodex as below. Tylenol, advil prn for pain.  ?-     ciprofloxacin-dexamethasone (CIPRODEX) OTIC suspension; Place 4 drops into the left ear 2 (two) times daily for 7 days. ? ?Return to office for new or worsening symptoms, or if symptoms persist.  ? ?The patient indicates understanding of these issues and  agrees with the plan. ? ?Gwenlyn Perking, FNP ? ?

## 2021-03-31 NOTE — Patient Instructions (Signed)
Otitis Externa ?Otitis externa is an infection of the outer ear canal. The outer ear canal is the area between the outside of the ear and the eardrum. Otitis externa is sometimes called swimmer's ear. ?What are the causes? ?Common causes of this condition include: ?Swimming in dirty water. ?Moisture in the ear. ?An injury to the inside of the ear. ?An object stuck in the ear. ?A cut or scrape on the outside of the ear or in the ear canal. ?What increases the risk? ?You are more likely to develop this condition if you go swimming often. ?What are the signs or symptoms? ?The first symptom of this condition is often itching in the ear. Later symptoms of the condition include: ?Swelling of the ear. ?Redness in the ear. ?Ear pain. The pain may get worse when you pull on your ear. ?Pus coming from the ear. ?How is this diagnosed? ?This condition may be diagnosed by examining the ear and testing fluid from the ear for bacteria and funguses. ?How is this treated? ?This condition may be treated with: ?Antibiotic ear drops. These are often given for 10-14 days. ?Medicines to reduce itching and swelling. ?Follow these instructions at home: ?If you were prescribed antibiotic ear drops, use them as told by your health care provider. Do not stop using the antibiotic even if you start to feel better. ?Take over-the-counter and prescription medicines only as told by your health care provider. ?Avoid getting water in your ears as told by your health care provider. This may include avoiding swimming or water sports for a few days. ?Keep all follow-up visits. This is important. ?How is this prevented? ?Keep your ears dry. Use the corner of a towel to dry your ears after you swim or bathe. ?Avoid scratching or putting things in your ear. Doing these things can damage the ear canal or remove the protective wax that lines it, which makes it easier for bacteria and funguses to grow. ?Avoid swimming in lakes, polluted water, or swimming  pools that may not have enough chlorine. ?Contact a health care provider if: ?You have a fever. ?Your ear is still red, swollen, painful, or draining pus after 3 days. ?Your redness, swelling, or pain gets worse. ?You have a severe headache. ?Get help right away if: ?You have redness, swelling, and pain or tenderness in the area behind your ear. ?Summary ?Otitis externa is an infection of the outer ear canal. ?Common causes include swimming in dirty water, moisture in the ear, or a cut or scrape in the ear. ?Symptoms include pain, redness, and swelling of the ear canal. ?If you were prescribed antibiotic ear drops, use them as told by your health care provider. Do not stop using the antibiotic even if you start to feel better. ?This information is not intended to replace advice given to you by your health care provider. Make sure you discuss any questions you have with your health care provider. ?Document Revised: 03/09/2020 Document Reviewed: 03/09/2020 ?Elsevier Patient Education ? 2022 [employer] ? ?

## 2021-04-05 ENCOUNTER — Other Ambulatory Visit: Payer: Self-pay | Admitting: Nurse Practitioner

## 2021-04-05 DIAGNOSIS — F902 Attention-deficit hyperactivity disorder, combined type: Secondary | ICD-10-CM

## 2021-04-17 ENCOUNTER — Encounter: Payer: Self-pay | Admitting: Nurse Practitioner

## 2021-04-17 ENCOUNTER — Ambulatory Visit (INDEPENDENT_AMBULATORY_CARE_PROVIDER_SITE_OTHER): Payer: Commercial Managed Care - PPO | Admitting: Nurse Practitioner

## 2021-04-17 DIAGNOSIS — F902 Attention-deficit hyperactivity disorder, combined type: Secondary | ICD-10-CM | POA: Diagnosis not present

## 2021-04-17 MED ORDER — AMPHETAMINE-DEXTROAMPHET ER 30 MG PO CP24: 30.0000 mg | ORAL_CAPSULE | ORAL | 0 refills | Status: DC

## 2021-04-17 MED ORDER — AMPHETAMINE-DEXTROAMPHET ER 30 MG PO CP24: 30.0000 mg | ORAL_CAPSULE | Freq: Every day | ORAL | 0 refills | Status: DC

## 2021-04-17 NOTE — Patient Instructions (Signed)
Stress, Adult °Stress is a normal reaction to life events. Stress is what you feel when life demands more than you are used to, or more than you think you can handle. °Some stress can be useful, such as studying for a test or meeting a deadline at work. Stress that occurs too often or for too long can cause problems. Long-lasting stress is called chronic stress. Chronic stress can affect your emotional health and interfere with relationships and normal daily activities. °Too much stress can weaken your body's defense system (immune system) and increase your risk for physical illness. If you already have a medical problem, stress can make it worse. °What are the causes? °All sorts of life events can cause stress. An event that causes stress for one person may not be stressful for someone else. Major life events, whether positive or negative, commonly cause stress. Examples include: °Losing a job or starting a new job. °Losing a loved one. °Moving to a new town or home. °Getting married or divorced. °Having a baby. °Getting injured or sick. °Less obvious life events can also cause stress, especially if they occur day after day or in combination with each other. Examples include: °Working long hours. °Driving in traffic. °Caring for children. °Being in debt. °Being in a difficult relationship. °What are the signs or symptoms? °Stress can cause emotional and physical symptoms and can lead to unhealthy behaviors. These include the following: °Emotional symptoms °Anxiety. This is feeling worried, afraid, on edge, overwhelmed, or out of control. °Anger, including irritation or impatience. °Depression. This is feeling sad, down, helpless, or guilty. °Trouble focusing, remembering, or making decisions. °Physical symptoms °Aches and pains. These may affect your head, neck, back, stomach, or other areas of your body. °Tight muscles or a clenched jaw. °Low energy. °Trouble sleeping. °Unhealthy behaviors °Eating to feel better  (overeating) or skipping meals. °Working too much or putting off tasks. °Smoking, drinking alcohol, or using drugs to feel better. °How is this diagnosed? °A stress disorder is diagnosed through an assessment by your health care provider. A stress disorder may be diagnosed based on: °Your symptoms and any stressful life events. °Your medical history. °Tests to rule out other causes of your symptoms. °Depending on your condition, your health care provider may refer you to a specialist for further evaluation. °How is this treated? °Stress management techniques are the recommended treatment for stress. Medicine is not typically recommended for treating stress. °Techniques to reduce your reaction to stressful life events include: °Identifying stress. Monitor yourself for symptoms of stress and notice what causes stress for you. These skills may help you to avoid or prepare for stressful events. °Managing time. Set your priorities, keep a calendar of events, and learn to say no. These actions can help you avoid taking on too much. °Techniques for dealing with stress include: °Rethinking the problem. Try to think realistically about stressful events rather than ignoring them or overreacting. Try to find the positives in a stressful situation rather than focusing on the negatives. °Exercise. Physical exercise can release both physical and emotional tension. The key is to find a form of exercise that you enjoy and do it regularly. °Relaxation techniques. These relax the body and mind. Find one or more that you enjoy and use the techniques regularly. Examples include: °Meditation, deep breathing, or progressive relaxation techniques. °Yoga or tai chi. °Biofeedback, mindfulness techniques, or journaling. °Listening to music, being in nature, or taking part in other hobbies. °Practicing a healthy lifestyle. Eat a balanced diet,   drink plenty of water, limit or avoid caffeine, and get plenty of sleep. °Having a strong support  network. Spend time with family, friends, or other people you enjoy being around. Express your feelings and talk things over with someone you trust. °Counseling or talk therapy with a mental health provider may help if you are having trouble managing stress by yourself. °Follow these instructions at home: °Lifestyle ° °Avoid drugs. °Do not use any products that contain nicotine or tobacco. These products include cigarettes, chewing tobacco, and vaping devices, such as e-cigarettes. If you need help quitting, ask your health care provider. °If you drink alcohol: °Limit how much you have to: °0-1 drink a day for women who are not pregnant. °0-2 drinks a day for men. °Know how much alcohol is in a drink. In the [place] one drink equals one 12 oz bottle of beer (355 mL), one 5 oz glass of wine (148 mL), or one 1½ oz glass of hard liquor (44 mL). °Do not use alcohol or drugs to relax. °Eat a balanced diet that includes fresh fruits and vegetables, whole grains, lean meats, fish, eggs, beans, and low-fat dairy. Avoid processed foods and foods high in added fat, sugar, and salt. °Exercise at least 30 minutes on 5 or more days each week. °Get 7-8 hours of sleep each night. °General instructions ° °Practice stress management techniques as told by your health care provider. °Drink enough fluid to keep your urine pale yellow. °Take over-the-counter and prescription medicines only as told by your health care provider. °Keep all follow-up visits. This is important. °Contact a health care provider if: °Your symptoms get worse. °You have new symptoms. °You feel overwhelmed by your problems and can no longer manage them by yourself. °Get help right away if: °You have thoughts of hurting yourself or others. °Get help right awayif you feel like you may hurt yourself or others, or have thoughts about taking your own life. Go to your nearest emergency room or: °Call 911. °Call the National Suicide Prevention Lifeline at 1-800-273-8255 or  988. This is open 24 hours a day. °Text the Crisis Text Line at 741741. °Summary °Stress is a normal reaction to life events. It can cause problems if it happens too often or for too long. °Practicing stress management techniques is the best way to treat stress. °Counseling or talk therapy with a mental health provider may help if you are having trouble managing stress by yourself. °This information is not intended to replace advice given to you by your health care provider. Make sure you discuss any questions you have with your health care provider. °Document Revised: 08/04/2020 Document Reviewed: 08/04/2020 °Elsevier Patient Education © 2022 Elsevier Inc. ° °

## 2021-04-17 NOTE — Progress Notes (Signed)
? ?Subjective:  ? ? Patient ID: Ruth Petty, female    DOB: [birth year], 26 y.o.   MRN: [id number] ? ? ?Chief Complaint: ADHD ?  ? ?HPI: ? ?Ruth Petty is a 26 y.o. who identifies as a female who was assigned female at birth.  ? ?Social history: ?Lives with: parents ?Work history: in college- is trying to get into law school ? ?Patient for follow up of ADHD. Currently taking adderall XR 30mg  daily. ?Behavior- good ?Grades- have been good ?Medication side effects- none ?Weight loss- none ?Sleeping habits- no problems ?Any concerns- Is seeing GYN for her PCOS. She has had lots of fatigue despite exercising daily. ? ? ?[place] CSRS reviewed: Yes ?Any suspicious activity on [place] Csrs: No ? ?Contract signed: 04/17/21- drug screen today ? ? ?Comes in today for follow up of the following chronic medical issues: ? ?There are no diagnoses linked to this encounter. ? ?New complaints: ?None today ? ?No Known Allergies ?Outpatient Encounter Medications as of 04/17/2021  ?Medication Sig  ? busPIRone (BUSPAR) 15 MG tablet TAKE 1 TABLET BY MOUTH TWICE A DAY  ? escitalopram (LEXAPRO) 20 MG tablet Take 1 tablet (20 mg total) by mouth daily.  ? JUNEL 1.5/30 1.5-30 MG-MCG tablet Take 1 tablet by mouth daily.  ? mupirocin ointment (BACTROBAN) 2 % Apply to superficial cyst daily  ? Tazarotene (FABIOR) 0.1 % FOAM Apply 1 application topically daily.  ? traZODone (DESYREL) 50 MG tablet TAKE 1 TABLET BY MOUTH EVERYDAY AT BEDTIME  ? tretinoin (RETIN-A) 0.025 % cream Apply topically at bedtime.  ? [DISCONTINUED] loratadine (CLARITIN) 10 MG tablet Take 10 mg by mouth daily.  ? amphetamine-dextroamphetamine (ADDERALL XR) 30 MG 24 hr capsule Take 1 capsule (30 mg total) by mouth daily. Take 1 capsule by mouth at 7am and one at 1pm daily.  ? amphetamine-dextroamphetamine (ADDERALL XR) 30 MG 24 hr capsule Take 1 capsule (30 mg total) by mouth every morning. Take  1 capsule at 7am and one at 1pm daily  ? amphetamine-dextroamphetamine (ADDERALL XR)  30 MG 24 hr capsule Take 1 capsule (30 mg total) by mouth every morning. Take  1 capsule at 7am and one at 1pm daily  ? ?No facility-administered encounter medications on file as of 04/17/2021.  ? ? ?History reviewed. No pertinent surgical history. ? ?History reviewed. No pertinent family history. ? ? ? ?Controlled substance contract: n/a ? ? ? ? ?Review of Systems  ?Constitutional:  Negative for diaphoresis.  ?Eyes:  Negative for pain.  ?Respiratory:  Negative for shortness of breath.   ?Cardiovascular:  Negative for chest pain, palpitations and leg swelling.  ?Gastrointestinal:  Negative for abdominal pain.  ?Endocrine: Negative for polydipsia.  ?Skin:  Negative for rash.  ?Neurological:  Negative for dizziness, weakness and headaches.  ?Hematological:  Does not bruise/bleed easily.  ?All other systems reviewed and are negative. ? ?   ?Objective:  ? Physical Exam ?Vitals and nursing note reviewed.  ?Constitutional:   ?   General: She is not in acute distress. ?   Appearance: Normal appearance. She is well-developed.  ?Neck:  ?   Vascular: No carotid bruit or JVD.  ?Cardiovascular:  ?   Rate and Rhythm: Normal rate and regular rhythm.  ?   Heart sounds: Normal heart sounds.  ?Pulmonary:  ?   Effort: Pulmonary effort is normal. No respiratory distress.  ?   Breath sounds: Normal breath sounds. No wheezing or rales.  ?Chest:  ?   Chest wall:  No tenderness.  ?Abdominal:  ?   General: Bowel sounds are normal. There is no distension or abdominal bruit.  ?   Palpations: Abdomen is soft. There is no hepatomegaly, splenomegaly, mass or pulsatile mass.  ?   Tenderness: There is no abdominal tenderness.  ?Musculoskeletal:     ?   General: Normal range of motion.  ?   Cervical back: Normal range of motion and neck supple.  ?Lymphadenopathy:  ?   Cervical: No cervical adenopathy.  ?Skin: ?   General: Skin is warm and dry.  ?Neurological:  ?   Mental Status: She is alert and oriented to person, place, and time.  ?   Deep Tendon  Reflexes: Reflexes are normal and symmetric.  ?Psychiatric:     ?   Behavior: Behavior normal.     ?   Thought Content: Thought content normal.     ?   Judgment: Judgment normal.  ? ? ?BP 106/72   Pulse (!) 106   Temp 97.9 ?F (36.6 ?C) (Temporal)   Resp 20   Ht 5\' 7"  (1.702 m)   Wt 151 lb (68.5 kg)   LMP 03/21/2021 (Approximate)   SpO2 100%   BMI 23.65 kg/m?  ? ? ? ?   ?Assessment & Plan:  ? ?Ruth Petty in today with chief complaint of ADHD ? ? ?1. Attention deficit hyperactivity disorder (ADHD), combined type ?Stress management ?- amphetamine-dextroamphetamine (ADDERALL XR) 30 MG 24 hr capsule; Take 1 capsule (30 mg total) by mouth daily. Take 1 capsule by mouth at 7am and one at 1pm daily.  Dispense: 60 capsule; Refill: 0 ?- amphetamine-dextroamphetamine (ADDERALL XR) 30 MG 24 hr capsule; Take 1 capsule (30 mg total) by mouth every morning. Take  1 capsule at 7am and one at 1pm daily  Dispense: 60 capsule; Refill: 0 ?- amphetamine-dextroamphetamine (ADDERALL XR) 30 MG 24 hr capsule; Take 1 capsule (30 mg total) by mouth every morning. Take  1 capsule at 7am and one at 1pm daily  Dispense: 60 capsule; Refill: 0 ? ? ? ?The above assessment and management plan was discussed with the patient. The patient verbalized understanding of and has agreed to the management plan. Patient is aware to call the clinic if symptoms persist or worsen. Patient is aware when to return to the clinic for a follow-up visit. Patient educated on when it is appropriate to go to the emergency department.  ? ?Mary-Margaret Hassell Done, FNP ? ? ?

## 2021-04-21 LAB — TOXASSURE SELECT 13 (MW), URINE

## 2021-05-09 ENCOUNTER — Ambulatory Visit (INDEPENDENT_AMBULATORY_CARE_PROVIDER_SITE_OTHER): Payer: Commercial Managed Care - PPO | Admitting: Nurse Practitioner

## 2021-05-09 ENCOUNTER — Encounter: Payer: Self-pay | Admitting: Nurse Practitioner

## 2021-05-09 VITALS — BP 105/71 | HR 101 | Temp 98.1°F | Resp 20 | Ht 67.0 in | Wt 152.0 lb

## 2021-05-09 DIAGNOSIS — K2100 Gastro-esophageal reflux disease with esophagitis, without bleeding: Secondary | ICD-10-CM | POA: Diagnosis not present

## 2021-05-09 MED ORDER — OMEPRAZOLE 20 MG PO CPDR: 20.0000 mg | DELAYED_RELEASE_CAPSULE | Freq: Every day | ORAL | 3 refills | Status: DC

## 2021-05-09 NOTE — Patient Instructions (Signed)
Gastroesophageal Reflux Disease, Adult  Gastroesophageal reflux (GER) happens when acid from the stomach flows up into the tube that connects the mouth and the stomach (esophagus). Normally, food travels down the esophagus and stays in the stomach to be digested. With GER, food and stomach acid sometimes move back up into the esophagus. You may have a disease called gastroesophageal reflux disease (GERD) if the reflux: Happens often. Causes frequent or very bad symptoms. Causes problems such as damage to the esophagus. When this happens, the esophagus becomes sore and swollen. Over time, GERD can make small holes (ulcers) in the lining of the esophagus. What are the causes? This condition is caused by a problem with the muscle between the esophagus and the stomach. When this muscle is weak or not normal, it does not close properly to keep food and acid from coming back up from the stomach. The muscle can be weak because of: Tobacco use. Pregnancy. Having a certain type of hernia (hiatal hernia). Alcohol use. Certain foods and drinks, such as coffee, chocolate, onions, and peppermint. What increases the risk? Being overweight. Having a disease that affects your connective tissue. Taking NSAIDs, such a ibuprofen. What are the signs or symptoms? Heartburn. Difficult or painful swallowing. The feeling of having a lump in the throat. A bitter taste in the mouth. Bad breath. Having a lot of saliva. Having an upset or bloated stomach. Burping. Chest pain. Different conditions can cause chest pain. Make sure you see your doctor if you have chest pain. Shortness of breath or wheezing. A long-term cough or a cough at night. Wearing away of the surface of teeth (tooth enamel). Weight loss. How is this treated? Making changes to your diet. Taking medicine. Having surgery. Treatment will depend on how bad your symptoms are. Follow these instructions at home: Eating and drinking  Follow a  diet as told by your doctor. You may need to avoid foods and drinks such as: Coffee and tea, with or without caffeine. Drinks that contain alcohol. Energy drinks and sports drinks. Bubbly (carbonated) drinks or sodas. Chocolate and cocoa. Peppermint and mint flavorings. Garlic and onions. Horseradish. Spicy and acidic foods. These include peppers, chili powder, curry powder, vinegar, hot sauces, and BBQ sauce. Citrus fruit juices and citrus fruits, such as oranges, lemons, and limes. Tomato-based foods. These include red sauce, chili, salsa, and pizza with red sauce. Fried and fatty foods. These include donuts, french fries, potato chips, and high-fat dressings. High-fat meats. These include hot dogs, rib eye steak, sausage, ham, and bacon. High-fat dairy items, such as whole milk, butter, and cream cheese. Eat small meals often. Avoid eating large meals. Avoid drinking large amounts of liquid with your meals. Avoid eating meals during the 2-3 hours before bedtime. Avoid lying down right after you eat. Do not exercise right after you eat. Lifestyle  Do not smoke or use any products that contain nicotine or tobacco. If you need help quitting, ask your doctor. Try to lower your stress. If you need help doing this, ask your doctor. If you are overweight, lose an amount of weight that is healthy for you. Ask your doctor about a safe weight loss goal. General instructions Pay attention to any changes in your symptoms. Take over-the-counter and prescription medicines only as told by your doctor. Do not take aspirin, ibuprofen, or other NSAIDs unless your doctor says it is okay. Wear loose clothes. Do not wear anything tight around your waist. Raise (elevate) the head of your bed about   6 inches (15 cm). You may need to use a wedge to do this. Avoid bending over if this makes your symptoms worse. Keep all follow-up visits. Contact a doctor if: You have new symptoms. You lose weight and you  do not know why. You have trouble swallowing or it hurts to swallow. You have wheezing or a cough that keeps happening. You have a hoarse voice. Your symptoms do not get better with treatment. Get help right away if: You have sudden pain in your arms, neck, jaw, teeth, or back. You suddenly feel sweaty, dizzy, or light-headed. You have chest pain or shortness of breath. You vomit and the vomit is green, yellow, or black, or it looks like blood or coffee grounds. You faint. Your poop (stool) is red, bloody, or black. You cannot swallow, drink, or eat. These symptoms may represent a serious problem that is an emergency. Do not wait to see if the symptoms will go away. Get medical help right away. Call your local emergency services (911 in the [place] Do not drive yourself to the hospital. Summary If a person has gastroesophageal reflux disease (GERD), food and stomach acid move back up into the esophagus and cause symptoms or problems such as damage to the esophagus. Treatment will depend on how bad your symptoms are. Follow a diet as told by your doctor. Take all medicines only as told by your doctor. This information is not intended to replace advice given to you by your health care provider. Make sure you discuss any questions you have with your health care provider. Document Revised: 07/06/2019 Document Reviewed: 07/06/2019 Elsevier Patient Education  2023 Elsevier Inc.  

## 2021-05-09 NOTE — Progress Notes (Signed)
? ?Acute Office Visit ? ?Subjective:  ? ?  ?Patient ID: Ruth Petty, female    DOB: [birth year], 26 y.o.   MRN: [id number] ? ?Chief Complaint  ?Patient presents with  ? throat fullness  ?  Some congestion / increased GERD yesterday   ? ? ?HPI ?Patient is in today for GERD: Paitent complains of heartburn. This has been associated with belching, bilious reflux, and deep pressure at base of neck.  She denies cough, difficulty swallowing, dysphagia, and early satiety. Symptoms have been present for a few days. She denies dysphagia.  She has not lost weight. She denies melena, hematochezia, hematemesis, and coffee ground emesis. Medical therapy in the past has included none.  ? ?Review of Systems  ?Constitutional: Negative.   ?HENT: Negative.    ?Respiratory: Negative.    ?Cardiovascular: Negative.   ?Gastrointestinal:  Positive for heartburn. Negative for abdominal pain, nausea and vomiting.  ?Genitourinary: Negative.   ?Skin: Negative.  Negative for rash.  ?All other systems reviewed and are negative. ? ? ?   ?Objective:  ?  ?BP 105/71   Pulse (!) 101   Temp 98.1 ?F (36.7 ?C)   Resp 20   Ht 5\' 7"  (1.702 m)   Wt 152 lb (68.9 kg)   LMP 04/18/2021 (Approximate)   SpO2 97%   BMI 23.81 kg/m?  ?BP Readings from Last 3 Encounters:  ?05/09/21 105/71  ?04/17/21 106/72  ?03/31/21 107/76  ? ?Wt Readings from Last 3 Encounters:  ?05/09/21 152 lb (68.9 kg)  ?04/17/21 151 lb (68.5 kg)  ?03/31/21 153 lb 6 oz (69.6 kg)  ? ?  ? ?Physical Exam ?Vitals and nursing note reviewed.  ?Constitutional:   ?   Appearance: Normal appearance.  ?HENT:  ?   Head: Normocephalic.  ?   Right Ear: External ear normal.  ?   Left Ear: External ear normal.  ?   Nose: Nose normal.  ?   Mouth/Throat:  ?   Mouth: Mucous membranes are moist.  ?   Pharynx: Oropharynx is clear.  ?Eyes:  ?   Conjunctiva/sclera: Conjunctivae normal.  ?Cardiovascular:  ?   Rate and Rhythm: Normal rate and regular rhythm.  ?   Pulses: Normal pulses.  ?   Heart sounds:  Normal heart sounds.  ?Pulmonary:  ?   Effort: Pulmonary effort is normal.  ?   Breath sounds: Normal breath sounds.  ?Abdominal:  ?   General: Bowel sounds are normal.  ?Skin: ?   General: Skin is dry.  ?   Findings: No rash.  ?Neurological:  ?   General: No focal deficit present.  ?   Mental Status: She is alert and oriented to person, place, and time.  ?Psychiatric:     ?   Behavior: Behavior normal.  ? ? ?No results found for any visits on 05/09/21. ? ? ?   ?Assessment & Plan:  ?Symptoms of acid reflux/heartburn not well controlled in the past few days.  Provided education to patient with printed handouts given on management of GERD symptoms.  Started patient on omeprazole 20 mg capsule daily. ? ?Follow-up with worsening unresolved symptoms. ? ?Problem List Items Addressed This Visit   ?None ?Visit Diagnoses   ? ? Gastroesophageal reflux disease with esophagitis without hemorrhage    -  Primary  ? Relevant Medications  ? omeprazole (PRILOSEC) 20 MG capsule  ? ?  ? ? ?Meds ordered this encounter  ?Medications  ? omeprazole (PRILOSEC) 20 MG capsule  ?  Sig: Take 1 capsule (20 mg total) by mouth daily.  ?  Dispense:  30 capsule  ?  Refill:  3  ?  Order Specific Question:   Supervising Provider  ?  AnswerClaretta Fraise [id number]  ? ? ?Return if symptoms worsen or fail to improve. ? ?Ivy Lynn, NP ? ? ?

## 2021-05-16 ENCOUNTER — Other Ambulatory Visit: Payer: Self-pay | Admitting: Nurse Practitioner

## 2021-05-16 DIAGNOSIS — F411 Generalized anxiety disorder: Secondary | ICD-10-CM

## 2021-06-06 ENCOUNTER — Other Ambulatory Visit: Payer: Self-pay | Admitting: Nurse Practitioner

## 2021-06-06 DIAGNOSIS — F5104 Psychophysiologic insomnia: Secondary | ICD-10-CM

## 2021-06-17 ENCOUNTER — Other Ambulatory Visit: Payer: Self-pay | Admitting: Nurse Practitioner

## 2021-06-17 DIAGNOSIS — F331 Major depressive disorder, recurrent, moderate: Secondary | ICD-10-CM

## 2021-07-13 ENCOUNTER — Ambulatory Visit (INDEPENDENT_AMBULATORY_CARE_PROVIDER_SITE_OTHER): Payer: Commercial Managed Care - PPO | Admitting: Physician Assistant

## 2021-07-13 ENCOUNTER — Encounter: Payer: Self-pay | Admitting: Physician Assistant

## 2021-07-13 VITALS — BP 102/73 | HR 111 | Temp 98.6°F | Ht 68.0 in | Wt 148.0 lb

## 2021-07-13 DIAGNOSIS — R197 Diarrhea, unspecified: Secondary | ICD-10-CM

## 2021-07-13 NOTE — Patient Instructions (Signed)
Food Poisoning Food poisoning is caused by eating or drinking foods or drinks that are spoiled. In most cases, food poisoning is mild and lasts 2-7 days. However, some cases can be serious, especially for people who have a weak body defense system (immune system), like older people, children and babies, and pregnant women. What are the causes? This condition is caused by eating foods that contain viruses, bacteria, parasites, or mold. This can happen by: Not washing your hands well enough or often enough. Not storing food properly. Preparing, serving, and storing food on surfaces that are not clean. Cooking or eating with utensils that are not clean. Not cooking food to the right temperature. Viruses, bacteria, or parasites can hurt the intestine. This often causes very bad watery poop (diarrhea). The most common causes of this condition are: Viruses, such as: Norovirus. Rotavirus. Bacteria, such as: Salmonella. Listeria. E. coli (Escherichia coli). Parasites, such as: Giardia. Toxoplasma gondii. What are the signs or symptoms? Feeling like you may vomit (nauseous). Vomiting. Cramping or belly pain. Watery poop. Fever. Chills. Muscle aches. Not having enough water in the body (dehydration). How is this treated? Making sure that you get enough to drink. Taking medicines. In very bad cases, you may need: To be treated in a hospital. To get fluids through an IV tube. Follow these instructions at home: Eating and drinking  Drink enough fluids to keep your pee (urine) pale yellow. You may need to drink small amounts of clear liquids often. Avoid: Milk. Caffeine. Alcohol. Ask your doctor how you should get enough fluid in your body. Eat small meals often instead of eating large meals. Medicines Take over-the-counter and prescription medicines only as told by your doctor. Ask your doctor if you should keep taking any of your regular medicines. If you were prescribed an  antibiotic medicine, take it as told by your doctor. Do not stop taking the antibiotic even if you start to feel better. General instructions  Wash your hands with soap and water for at least 20 seconds: Before you prepare food. After you go to the bathroom (use the toilet). Make sure that the people who live with you also wash their hands often. Rest at home until you feel better. Clean surfaces that you touch with a product that contains chlorine bleach. Keep all follow-up visits. How is this prevented? Before and after you handle raw foods: Wash your hands. Wash surfaces used to prepare the food. Wash utensils. Do not prepare or store raw meat in the same place you prepare or store fresh fruits and vegetables. Keep refrigerated foods colder than 40F (5C). Serve hot foods right away or keep them heated above 140F (60C). Store dry foods in cool, dry spaces. Keep them away from too much heat or moisture. Throw out any foods that: Do not smell right. Are in cans that are bulging. Heat canned foods fully before you taste them. Drink bottled or germ-free (sterile) water when you travel. Get help right away if: You have trouble: Breathing. Swallowing. Talking. Moving. You have blurry vision. You faint. The whites of your eyes turn yellow (jaundice). You cannot eat or drink without vomiting. You continue to vomit or have watery poop. You start to have pain in your belly, your belly pain gets worse, or your belly pain stays in one small area. You have a fever. You have blood or mucus in your poop (stools), or your poop looks dark black and tarry. You have signs of not having enough water in   your body, such as: Dark pee, very little pee, or no pee. Not making tears while crying. Dry mouth or lips. Sunken eyes. Being sleepy. Feeling weak. Being dizzy. These symptoms may be an emergency. Get help right away. Call 911. Do not wait to see if the symptoms will go away. Do not  drive yourself to the hospital. Summary Food poisoning is an illness that is caused by eating or drinking spoiled foods or drinks. Symptoms may include feeling like you may vomit, vomiting, and having watery poop. In most cases, the illness will get better on its own in 2-7 days. In very bad cases, you may need to stay at the hospital. This information is not intended to replace advice given to you by your health care provider. Make sure you discuss any questions you have with your health care provider. Document Revised: 11/14/2020 Document Reviewed: 11/14/2020 Elsevier Patient Education  2023 Elsevier Inc.  

## 2021-07-13 NOTE — Progress Notes (Signed)
  Subjective:     Patient ID: Ruth Petty, female   DOB: Jan 10, 1995, 26 y.o.   MRN: [id number]  Diarrhea  Associated symptoms include a fever. Pertinent negatives include no abdominal pain.  Fever  Associated symptoms include diarrhea and nausea. Pertinent negatives include no abdominal pain.   Pt with nausea and diarrhea after eating out Other family  members with same Using OTC Tylenol for sx  Review of Systems  Constitutional:  Positive for activity change, appetite change and fever.  Respiratory: Negative.    Cardiovascular: Negative.   Gastrointestinal:  Positive for diarrhea and nausea. Negative for abdominal pain and blood in stool.       Objective:   Physical Exam Vitals and nursing note reviewed.  Constitutional:      General: She is not in acute distress.    Appearance: Normal appearance. She is ill-appearing. She is not toxic-appearing.  HENT:     Mouth/Throat:     Mouth: Mucous membranes are moist.     Pharynx: Oropharynx is clear.  Cardiovascular:     Rate and Rhythm: Normal rate and regular rhythm.     Pulses: Normal pulses.     Heart sounds: Normal heart sounds.  Pulmonary:     Effort: Pulmonary effort is normal.     Breath sounds: Normal breath sounds.  Abdominal:     General: Abdomen is flat. There is no distension.     Palpations: Abdomen is soft. There is no mass.     Tenderness: There is no abdominal tenderness. There is no guarding or rebound.  Neurological:     Mental Status: She is alert.        Assessment:     1. Diarrhea, unspecified type        Plan:     Fluids - caff/dairy free BRAT diet - progress slowly OTC Imodium prn Probiotic prn F/U prn PATP

## 2021-07-24 ENCOUNTER — Encounter: Payer: Self-pay | Admitting: Nurse Practitioner

## 2021-07-24 ENCOUNTER — Other Ambulatory Visit: Payer: Self-pay | Admitting: Nurse Practitioner

## 2021-07-24 DIAGNOSIS — F331 Major depressive disorder, recurrent, moderate: Secondary | ICD-10-CM

## 2021-07-24 NOTE — Telephone Encounter (Signed)
90 day supply Lexapro sent in 06/19/21  Refill denied today-should have enough to last through 9/12 and ntbs around then for further refills, please schedule appt

## 2021-07-24 NOTE — Telephone Encounter (Signed)
LMTCB TO SCHEDULE APPT LETTER MAILED 

## 2021-08-11 ENCOUNTER — Other Ambulatory Visit: Payer: Self-pay | Admitting: Nurse Practitioner

## 2021-08-11 DIAGNOSIS — F411 Generalized anxiety disorder: Secondary | ICD-10-CM

## 2021-08-22 ENCOUNTER — Encounter: Payer: Self-pay | Admitting: Nurse Practitioner

## 2021-08-22 ENCOUNTER — Ambulatory Visit (INDEPENDENT_AMBULATORY_CARE_PROVIDER_SITE_OTHER): Payer: Commercial Managed Care - PPO | Admitting: Nurse Practitioner

## 2021-08-22 VITALS — BP 110/76 | HR 113 | Temp 97.9°F | Resp 20 | Ht 68.0 in | Wt 150.0 lb

## 2021-08-22 DIAGNOSIS — F411 Generalized anxiety disorder: Secondary | ICD-10-CM

## 2021-08-22 DIAGNOSIS — F902 Attention-deficit hyperactivity disorder, combined type: Secondary | ICD-10-CM

## 2021-08-22 DIAGNOSIS — F331 Major depressive disorder, recurrent, moderate: Secondary | ICD-10-CM | POA: Diagnosis not present

## 2021-08-22 DIAGNOSIS — Z111 Encounter for screening for respiratory tuberculosis: Secondary | ICD-10-CM

## 2021-08-22 MED ORDER — AMPHETAMINE-DEXTROAMPHET ER 30 MG PO CP24: 60.0000 mg | ORAL_CAPSULE | ORAL | 0 refills | Status: DC

## 2021-08-22 MED ORDER — ESCITALOPRAM OXALATE 20 MG PO TABS: 20.0000 mg | ORAL_TABLET | Freq: Every day | ORAL | 1 refills | Status: DC

## 2021-08-22 MED ORDER — BUSPIRONE HCL 15 MG PO TABS: 15.0000 mg | ORAL_TABLET | Freq: Two times a day (BID) | ORAL | 0 refills | Status: DC

## 2021-08-22 MED ORDER — AMPHETAMINE-DEXTROAMPHET ER 30 MG PO CP24: 60.0000 mg | ORAL_CAPSULE | Freq: Every day | ORAL | 0 refills | Status: DC

## 2021-08-22 NOTE — Progress Notes (Signed)
Subjective:    Patient ID: Ruth Petty, female    DOB: 03/13/1995, 26 y.o.   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 who identifies as a female who was assigned female at birth.   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 Attention deficit hyperactivity disorder (ADHD), combined type Is on adderall xr and is unable to concentrate without meds. She has been on meds since she was in high school.  2. Moderate episode of recurrent major depressive disorder (HCC) Is currently on lexapro and is doing well    08/22/2021    2:50 PM 07/13/2021    9:39 AM 05/09/2021    2:11 PM  Depression screen PHQ 2/9  Decreased Interest 1 1 1   Down, Depressed, Hopeless 1 1 1   PHQ - 2 Score 2 2 2   Altered sleeping 0 1 1  Tired, decreased energy 1 1 1   Change in appetite 0 1 1  Feeling bad or failure about yourself  1 1 1   Trouble concentrating 0 0 1  Moving slowly or fidgety/restless 0 0 0  Suicidal thoughts 0 0 0  PHQ-9 Score 4 6 7   Difficult doing work/chores Somewhat difficult Somewhat difficult Somewhat difficult     3. Generalized anxiety disorder Takes buspar as needed     08/22/2021    2:51 PM 07/13/2021    9:40 AM 05/09/2021    2:13 PM 04/17/2021    9:47 AM  GAD 7 : Generalized Anxiety Score  Nervous, Anxious, on Edge 1 1 1 1   Control/stop worrying 1 1 1 1   Worry too much - different things 1 1 1 1   Trouble relaxing 1 1 1 1   Restless 0 1 0 1  Easily annoyed or irritable 1 1 1 1   Afraid - awful might happen 1 1 1 1   Total GAD 7 Score 6 7 6 7   Anxiety Difficulty Somewhat difficult Somewhat difficult Somewhat difficult Somewhat difficult       New complaints: None today  No Known Allergies Outpatient Encounter Medications as of 08/22/2021  Medication Sig    amphetamine-dextroamphetamine (ADDERALL XR) 30 MG 24 hr capsule Take 1 capsule (30 mg total) by mouth daily. Take 1 capsule by mouth at 7am and one at 1pm daily.   amphetamine-dextroamphetamine (ADDERALL XR) 30 MG 24 hr capsule Take 1 capsule (30 mg total) by mouth every morning. Take  1 capsule at 7am and one at 1pm daily   amphetamine-dextroamphetamine (ADDERALL XR) 30 MG 24 hr capsule Take 1 capsule (30 mg total) by mouth every morning. Take  1 capsule at 7am and one at 1pm daily   busPIRone (BUSPAR) 15 MG tablet Take 1 tablet (15 mg total) by mouth 2 (two) times daily. (NEEDS TO BE SEEN BEFORE NEXT REFILL)   escitalopram (LEXAPRO) 20 MG tablet TAKE 1 TABLET DAILY   JUNEL 1.5/30 1.5-30 MG-MCG tablet Take 1 tablet by mouth daily.   mupirocin ointment (BACTROBAN) 2 % Apply to superficial cyst daily   omeprazole (PRILOSEC) 20 MG capsule Take 1 capsule (20 mg total) by mouth daily.   Tazarotene (FABIOR) 0.1 % FOAM Apply 1 application topically daily.   traZODone (DESYREL) 50 MG tablet TAKE 1 TABLET EVERY NIGHT AT BEDTIME   tretinoin (  RETIN-A) 0.025 % cream Apply topically at bedtime.   No facility-administered encounter medications on file as of 08/22/2021.    No past surgical history on file.  No family history on file.    Controlled substance contract: 04/20/21     Review of Systems  Constitutional:  Negative for diaphoresis.  Eyes:  Negative for pain.  Respiratory:  Negative for shortness of breath.   Cardiovascular:  Negative for chest pain, palpitations and leg swelling.  Gastrointestinal:  Negative for abdominal pain.  Endocrine: Negative for polydipsia.  Skin:  Negative for rash.  Neurological:  Negative for dizziness, weakness and headaches.  Hematological:  Does not bruise/bleed easily.  All other systems reviewed and are negative.      Objective:   Physical Exam Vitals reviewed.  Constitutional:      Appearance: Normal appearance.  Cardiovascular:     Rate and  Rhythm: Normal rate and regular rhythm.     Heart sounds: Normal heart sounds.  Pulmonary:     Effort: Pulmonary effort is normal.     Breath sounds: Normal breath sounds.  Skin:    General: Skin is warm.  Neurological:     General: No focal deficit present.     Mental Status: She is alert and oriented to person, place, and time.  Psychiatric:        Mood and Affect: Mood normal.        Behavior: Behavior normal.    BP 110/76   Pulse (!) 113   Temp 97.9 F (36.6 C) (Temporal)   Resp 20   Ht 5\' 8"  (1.727 m)   Wt 150 lb (68 kg)   SpO2 98%   BMI 22.81 kg/m         Assessment & Plan:   in today with chief complaint of ADHD and Fill out form for teaching   1. Attention deficit hyperactivity disorder (ADHD), combined type stress management  -amphetamine-dextroamphetamine (ADDERALL XR) 30 MG 24 hr capsule; Take 2 capsules (60 mg total) by mouth daily. Take 1 capsule by mouth at 7am and one at 1pm daily.  Dispense: 60 capsule; Refill: 0 - amphetamine-dextroamphetamine (ADDERALL XR) 30 MG 24 hr capsule; Take 2 capsules (60 mg total) by mouth every morning. Take  1 capsule at 7am and one at 1pm daily  Dispense: 60 capsule; Refill: 0 - amphetamine-dextroamphetamine (ADDERALL XR) 30 MG 24 hr capsule; Take 2 capsules (60 mg total) by mouth every morning. Take  1 capsule at 7am and one at 1pm daily  Dispense: 60 capsule; Refill: 0  2. Moderate episode of recurrent major depressive disorder (HCC) - escitalopram (LEXAPRO) 20 MG tablet; Take 1 tablet (20 mg total) by mouth daily.  Dispense: 90 tablet; Refill: 1  3. Generalized anxiety disorder - busPIRone (BUSPAR) 15 MG tablet; Take 1 tablet (15 mg total) by mouth 2 (two) times daily. (NEEDS TO BE SEEN BEFORE NEXT REFILL)  Dispense: 60 tablet; Refill: 0    The above assessment and management plan was discussed with the patient. The patient verbalized understanding of and has agreed to the management plan. Patient is  aware to call the clinic if symptoms persist or worsen. Patient is aware when to return to the clinic for a follow-up visit. Patient educated on when it is appropriate to go to the emergency department.   Mary-Margaret Jackqulyn Livings, FNP

## 2021-08-22 NOTE — Addendum Note (Signed)
Addended by: Cleda Daub on: 08/22/2021 03:13 PM   Modules accepted: Orders

## 2021-08-22 NOTE — Patient Instructions (Signed)

## 2021-08-24 ENCOUNTER — Ambulatory Visit: Payer: Commercial Managed Care - PPO

## 2021-08-24 DIAGNOSIS — Z111 Encounter for screening for respiratory tuberculosis: Secondary | ICD-10-CM

## 2021-08-24 LAB — TB SKIN TEST
Induration: 0 mm
TB Skin Test: NEGATIVE

## 2021-08-24 NOTE — Progress Notes (Signed)
Patient here to have TB skin test read.  Results were negative.

## 2021-09-02 ENCOUNTER — Other Ambulatory Visit: Payer: Self-pay | Admitting: Nurse Practitioner

## 2021-09-02 DIAGNOSIS — F411 Generalized anxiety disorder: Secondary | ICD-10-CM

## 2021-09-09 ENCOUNTER — Other Ambulatory Visit: Payer: Self-pay | Admitting: Nurse Practitioner

## 2021-09-09 DIAGNOSIS — F5104 Psychophysiologic insomnia: Secondary | ICD-10-CM

## 2021-09-12 NOTE — Telephone Encounter (Signed)
Last office visit 08/22/21 Last refill 06/06/21, #90, no refills

## 2021-09-14 ENCOUNTER — Encounter: Payer: Self-pay | Admitting: Family Medicine

## 2021-09-14 ENCOUNTER — Ambulatory Visit (INDEPENDENT_AMBULATORY_CARE_PROVIDER_SITE_OTHER): Payer: Commercial Managed Care - PPO | Admitting: Family Medicine

## 2021-09-14 VITALS — BP 114/71 | HR 99 | Temp 97.8°F | Ht 68.0 in | Wt 152.2 lb

## 2021-09-14 DIAGNOSIS — J040 Acute laryngitis: Secondary | ICD-10-CM

## 2021-09-14 MED ORDER — BETAMETHASONE SOD PHOS & ACET 6 (3-3) MG/ML IJ SUSP
6.0000 mg | Freq: Once | INTRAMUSCULAR | Status: AC
  Administered 2021-09-14: 6 mg via INTRAMUSCULAR

## 2021-09-14 NOTE — Progress Notes (Signed)
Chief Complaint  Patient presents with   Hoarse    HPINo fever.   Patient presents today for 2 days of UR sx. Pimarily runn nose. Congested. No sore throat and no fever. 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 No earache. No cough.   PMH: Smoking status noted ROS: Per HPI  Objective: BP 114/71   Pulse 99   Temp 97.8 F (36.6 C)   Ht 5\' 8"  (1.727 m)   Wt 152 lb 3.2 oz (69 kg)   SpO2 98%   BMI 23.14 kg/m  Gen: NAD, alert, cooperative with exam HEENT: NCAT, EOMI, PERRL CV: RRR, good S1/S2, no murmur. Mild erythema at posterior oropharynx. Resp: CTABL, no wheezes, non-labored Ext: No edema, warm Neuro: Alert and oriented, No gross deficits  Assessment and plan:  1. Laryngitis     Meds ordered this encounter  Medications   betamethasone acetate-betamethasone sodium phosphate (CELESTONE) injection 6 mg    Follow up as needed.  , MD

## 2021-10-11 ENCOUNTER — Ambulatory Visit (INDEPENDENT_AMBULATORY_CARE_PROVIDER_SITE_OTHER): Payer: BC Managed Care – PPO | Admitting: Nurse Practitioner

## 2021-10-11 ENCOUNTER — Encounter: Payer: Self-pay | Admitting: Nurse Practitioner

## 2021-10-11 VITALS — BP 116/86 | Temp 98.8°F | Ht 68.0 in | Wt 150.0 lb

## 2021-10-11 DIAGNOSIS — J040 Acute laryngitis: Secondary | ICD-10-CM

## 2021-10-11 MED ORDER — PREDNISONE 5 MG PO TABS: 5.0000 mg | ORAL_TABLET | Freq: Every day | ORAL | 0 refills | Status: DC

## 2021-10-11 NOTE — Patient Instructions (Signed)
Laryngitis  Laryngitis is irritation and swelling (inflammation) of your vocal cords. It causes your voice to sound hoarse and may cause you to lose your voice. Depending on the cause, this condition may go away after a short time or may last for more than 3 weeks. Treatment often involves resting your voice and using medicines to soothe your throat. What are the causes? Laryngitis that lasts for a short time may be caused by: An infection caused by a virus. Lots of talking, yelling, or singing. This is also called vocal strain. An infection caused by bacteria. Laryngitis that lasts for more than 3 weeks can be caused by: Lots of talking, yelling, or singing. An injury to the vocal cords. Acid reflux. Allergies. Sinus infection. Mucus draining from the nose down the throat (postnasal drip). Smoking. Drinking too much alcohol. Breathing in chemicals or dust. Having growths on the vocal cords. What increases the risk? Smoking. Drinking too much alcohol. Having allergies. Breathing in fumes at work. What are the signs or symptoms? A change in your voice. It may sound low and hoarse. Loss of voice. Dry cough. Sore throat. Dry throat. Stuffy nose. How is this treated? Treatment depends on what is causing the laryngitis. Usually, treatment includes: Resting your voice. Using medicines to soothe your throat. If your laryngitis is caused by an infection from bacteria, you may need to take antibiotics. If your laryngitis is caused by a growth on your vocal cords, you may need to have a surgery to remove it. Follow these instructions at home: Medicines Take over-the-counter and prescription medicines only as told by your doctor. If you were prescribed an antibiotic medicine, take it as told by your doctor. Do not stop taking it even if you start to feel better. Use throat lozenges or sprays to soothe your throat as told by your doctor. General instructions  Talk as little as  possible. To do this: Avoid whispering. Write instead of talking. Do this until your voice is back to normal. Rinse your mouth (gargle) with a salt water mixture 3-4 times a day or as needed. To make salt water, dissolve -1 tsp (3-6 g) of salt in 1 cup (237 mL) of warm water. Do not swallow this mixture. Drink enough fluid to keep your pee (urine) pale yellow. Breathe in moist air. Use a humidifier if you live in a dry climate. Do not smoke or use any products that contain nicotine or tobacco. If you need help quitting, ask your doctor. Contact a doctor if: You have a fever. Your pain is worse. Your symptoms do not get better in 2 weeks. Get help right away if: You cough up blood. You have trouble swallowing. You have trouble breathing. Summary Laryngitis is inflammation of your vocal cords. This condition causes your voice to sound low and hoarse. Rest your voice by talking as little as possible. Also avoid whispering. Get help right away if you have trouble swallowing or breathing or if you cough up blood. This information is not intended to replace advice given to you by your health care provider. Make sure you discuss any questions you have with your health care provider. Document Revised: 03/14/2020 Document Reviewed: 03/14/2020 Elsevier Patient Education  2023 Elsevier Inc.  

## 2021-10-11 NOTE — Progress Notes (Signed)
Acute Office Visit  Subjective:     Patient ID: Ruth Petty, female    DOB: Sep 20, 1995, 26 y.o.   MRN: [id number]  Chief Complaint  Patient presents with   Hoarse    4 days    Laryngitis    Sore Throat  This is a new problem. The current episode started yesterday. The problem has been unchanged. Neither side of throat is experiencing more pain than the other. There has been no fever. The pain is mild. Associated symptoms include coughing and a hoarse voice. Pertinent negatives include no abdominal pain, congestion, headaches, shortness of breath or stridor. She has had no exposure to strep. She has tried nothing for the symptoms.     Review of Systems  Constitutional:  Negative for fever.  HENT:  Positive for hoarse voice. Negative for congestion.   Respiratory:  Positive for cough. Negative for shortness of breath and stridor.   Cardiovascular: Negative.   Gastrointestinal:  Negative for abdominal pain.  Skin: Negative.  Negative for itching and rash.  Neurological:  Negative for headaches.  All other systems reviewed and are negative.       Objective:    BP 116/86   Temp 98.8 F (37.1 C)   Ht 5\' 8"  (1.727 m)   Wt 150 lb (68 kg)   BMI 22.81 kg/m  BP Readings from Last 3 Encounters:  10/11/21 116/86  09/14/21 114/71  08/22/21 110/76      Physical Exam Vitals and nursing note reviewed.  Constitutional:      Appearance: Normal appearance.  HENT:     Head: Normocephalic.     Right Ear: External ear normal.     Left Ear: External ear normal.     Nose: Nose normal. No congestion or rhinorrhea.     Mouth/Throat:     Mouth: Mucous membranes are moist.     Pharynx: Oropharynx is clear.  Eyes:     Conjunctiva/sclera: Conjunctivae normal.  Cardiovascular:     Rate and Rhythm: Normal rate.     Pulses: Normal pulses.     Heart sounds: Normal heart sounds.  Pulmonary:     Effort: Pulmonary effort is normal.     Breath sounds: Normal breath sounds.   Abdominal:     General: Bowel sounds are normal.  Neurological:     General: No focal deficit present.     Mental Status: She is alert and oriented to person, place, and time.     No results found for any visits on 10/11/21.      Assessment & Plan:  Patient presents with hoarseness symptoms presents as laryngitis.  Patient is a Education officer, museum and talking all day. Take meds as prescribed -Throat lozenges Rest your voice as much as possible - Use a cool mist humidifier  -Use saline nose sprays frequently -Force fluids -For fever or aches or pains- take Tylenol or ibuprofen. -If symptoms do not improve, she may need to be COVID tested to rule this out Follow up with worsening unresolved symptoms    Problem List Items Addressed This Visit   None Visit Diagnoses     Laryngitis    -  Primary   Relevant Medications   predniSONE (DELTASONE) 5 MG tablet       Meds ordered this encounter  Medications   predniSONE (DELTASONE) 5 MG tablet    Sig: Take 1 tablet (5 mg total) by mouth daily with breakfast.    Dispense:  6 tablet  Refill:  0    Order Specific Question:   Supervising Provider    Answer:   Claretta Fraise [[id number]]    Return if symptoms worsen or fail to improve.  Ivy Lynn, NP

## 2021-10-12 NOTE — Progress Notes (Deleted)
 Acute Office Visit  Subjective:     Patient ID: Ruth Petty, female    DOB: 02/03/1995, 26 y.o.   MRN: [id number]  Chief Complaint  Patient presents with   Hoarse    4 days    Laryngitis    Sore Throat  This is a new problem. The current episode started yesterday. The problem has been unchanged. Neither side of throat is experiencing more pain than the other. There has been no fever. The pain is mild. Associated symptoms include coughing and a hoarse voice. Pertinent negatives include no abdominal pain, congestion, headaches, shortness of breath or stridor. She has had no exposure to strep. She has tried nothing for the symptoms.     Review of Systems  Constitutional:  Negative for fever.  HENT:  Positive for hoarse voice. Negative for congestion.   Respiratory:  Positive for cough. Negative for shortness of breath and stridor.   Cardiovascular: Negative.   Gastrointestinal:  Negative for abdominal pain.  Skin: Negative.  Negative for itching and rash.  Neurological:  Negative for headaches.  All other systems reviewed and are negative.       Objective:    BP 116/86   Temp 98.8 F (37.1 C)   Ht 5' 8" (1.727 m)   Wt 150 lb (68 kg)   BMI 22.81 kg/m  BP Readings from Last 3 Encounters:  10/11/21 116/86  09/14/21 114/71  08/22/21 110/76      Physical Exam Vitals and nursing note reviewed.  Constitutional:      Appearance: Normal appearance.  HENT:     Head: Normocephalic.     Right Ear: External ear normal.     Left Ear: External ear normal.     Nose: Nose normal. No congestion or rhinorrhea.     Mouth/Throat:     Mouth: Mucous membranes are moist.     Pharynx: Oropharynx is clear.  Eyes:     Conjunctiva/sclera: Conjunctivae normal.  Cardiovascular:     Rate and Rhythm: Normal rate.     Pulses: Normal pulses.     Heart sounds: Normal heart sounds.  Pulmonary:     Effort: Pulmonary effort is normal.     Breath sounds: Normal breath sounds.   Abdominal:     General: Bowel sounds are normal.  Neurological:     General: No focal deficit present.     Mental Status: She is alert and oriented to person, place, and time.     No results found for any visits on 10/11/21.      Assessment & Plan:  Patient presents with hoarseness symptoms presents as laryngitis.  Patient is a school teacher and talking all day. Take meds as prescribed -Throat lozenges Rest your voice as much as possible - Use a cool mist humidifier  -Use saline nose sprays frequently -Force fluids -For fever or aches or pains- take Tylenol or ibuprofen. -If symptoms do not improve, she may need to be COVID tested to rule this out Follow up with worsening unresolved symptoms    Problem List Items Addressed This Visit   None Visit Diagnoses     Laryngitis    -  Primary   Relevant Medications   predniSONE (DELTASONE) 5 MG tablet       Meds ordered this encounter  Medications   predniSONE (DELTASONE) 5 MG tablet    Sig: Take 1 tablet (5 mg total) by mouth daily with breakfast.    Dispense:  6 tablet      Refill:  0    Order Specific Question:   Supervising Provider    Answer:   Claretta Fraise [[id number]]    Return if symptoms worsen or fail to improve.  Ivy Lynn, NP

## 2021-10-12 NOTE — Progress Notes (Signed)
Acute Office Visit  Subjective:     Patient ID: Ruth Petty, female    DOB: 02/26/1995, 26 y.o.   MRN: [id number]  Chief Complaint  Patient presents with   Hoarse    4 days    Laryngitis    Sore Throat  This is a new problem. The current episode started yesterday. The problem has been unchanged. Neither side of throat is experiencing more pain than the other. There has been no fever. The pain is mild. Associated symptoms include coughing and a hoarse voice. Pertinent negatives include no abdominal pain, congestion, headaches, shortness of breath or stridor. She has had no exposure to strep. She has tried nothing for the symptoms.     Review of Systems  Constitutional:  Negative for fever.  HENT:  Positive for hoarse voice. Negative for congestion.   Respiratory:  Positive for cough. Negative for shortness of breath and stridor.   Cardiovascular: Negative.   Gastrointestinal:  Negative for abdominal pain.  Skin: Negative.  Negative for itching and rash.  Neurological:  Negative for headaches.  All other systems reviewed and are negative.       Objective:    BP 116/86   Temp 98.8 F (37.1 C)   Ht 5\' 8"  (1.727 m)   Wt 150 lb (68 kg)   BMI 22.81 kg/m  BP Readings from Last 3 Encounters:  10/11/21 116/86  09/14/21 114/71  08/22/21 110/76      Physical Exam Vitals and nursing note reviewed.  Constitutional:      Appearance: Normal appearance.  HENT:     Head: Normocephalic.     Right Ear: External ear normal.     Left Ear: External ear normal.     Nose: Nose normal. No congestion or rhinorrhea.     Mouth/Throat:     Mouth: Mucous membranes are moist.     Pharynx: Oropharynx is clear.  Eyes:     Conjunctiva/sclera: Conjunctivae normal.  Cardiovascular:     Rate and Rhythm: Normal rate.     Pulses: Normal pulses.     Heart sounds: Normal heart sounds.  Pulmonary:     Effort: Pulmonary effort is normal.     Breath sounds: Normal breath sounds.   Abdominal:     General: Bowel sounds are normal.  Neurological:     General: No focal deficit present.     Mental Status: She is alert and oriented to person, place, and time.     No results found for any visits on 10/11/21.      Assessment & Plan:  Patient presents with hoarseness symptoms presents as laryngitis.  Patient is a Education officer, museum and talking all day. Take meds as prescribed -Throat lozenges Rest your voice as much as possible - Use a cool mist humidifier  -Use saline nose sprays frequently -Force fluids -For fever or aches or pains- take Tylenol or ibuprofen. -If symptoms do not improve, she may need to be COVID tested to rule this out Follow up with worsening unresolved symptoms    Problem List Items Addressed This Visit   None Visit Diagnoses     Laryngitis    -  Primary   Relevant Medications   predniSONE (DELTASONE) 5 MG tablet       Meds ordered this encounter  Medications   predniSONE (DELTASONE) 5 MG tablet    Sig: Take 1 tablet (5 mg total) by mouth daily with breakfast.    Dispense:  6 tablet  Refill:  0    Order Specific Question:   Supervising Provider    Answer:   Claretta Fraise [[id number]]    Return if symptoms worsen or fail to improve.  Ivy Lynn, NP

## 2021-10-18 ENCOUNTER — Telehealth (INDEPENDENT_AMBULATORY_CARE_PROVIDER_SITE_OTHER): Payer: BC Managed Care – PPO | Admitting: Nurse Practitioner

## 2021-10-18 ENCOUNTER — Encounter: Payer: Self-pay | Admitting: Nurse Practitioner

## 2021-10-18 DIAGNOSIS — J069 Acute upper respiratory infection, unspecified: Secondary | ICD-10-CM

## 2021-10-18 MED ORDER — PSEUDOEPH-BROMPHEN-DM 30-2-10 MG/5ML PO SYRP: 5.0000 mL | ORAL_SOLUTION | Freq: Four times a day (QID) | ORAL | 0 refills | Status: DC | PRN

## 2021-10-18 NOTE — Progress Notes (Signed)
   Virtual Visit  Note Due to COVID-19 pandemic this visit was conducted virtually. This visit type was conducted due to national recommendations for restrictions regarding the COVID-19 Pandemic (e.g. social distancing, sheltering in place) in an effort to limit this patient's exposure and mitigate transmission in our community. All issues noted in this document were discussed and addressed.  A physical exam was not performed with this format.  I connected with [name] on 10/18/21 at 12:30 pm  by telephone and verified that I am speaking with the correct person using two identifiers. 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 The provider, Ivy Lynn, NP is located in their office at time of visit.  I discussed the limitations, risks, security and privacy concerns of performing an evaluation and management service by telephone and the availability of in person appointments. I also discussed with the patient that there may be a patient responsible charge related to this service. The patient expressed understanding and agreed to proceed.   History and Present Illness:  URI  This is a recurrent problem. The problem has been unchanged. There has been no fever. Associated symptoms include congestion and coughing. Pertinent negatives include no abdominal pain, diarrhea, ear pain, headaches, joint pain, joint swelling, nausea or plugged ear sensation. She has tried decongestant and increased fluids for the symptoms. The treatment provided mild relief.      Review of Systems  HENT:  Positive for congestion. Negative for ear pain.   Respiratory:  Positive for cough.   Gastrointestinal:  Negative for abdominal pain, diarrhea and nausea.  Musculoskeletal:  Negative for joint pain.  Neurological:  Negative for headaches.     Observations/Objective: Televisit patient not in distress.  Assessment and Plan: Presents with unresolved cough and congestion. Take  meds as prescribed - Use a cool mist humidifier  -Use saline nose sprays frequently -Force fluids -Bromfed for cough and congestion -For fever or aches or pains- take Tylenol or ibuprofen. -If symptoms do not improve, she may need to be COVID tested to rule this out   Follow Up Instructions: Follow-up with worsening unresolved symptoms    I discussed the assessment and treatment plan with the patient. The patient was provided an opportunity to ask questions and all were answered. The patient agreed with the plan and demonstrated an understanding of the instructions.   The patient was advised to call back or seek an in-person evaluation if the symptoms worsen or if the condition fails to improve as anticipated.  The above assessment and management plan was discussed with the patient. The patient verbalized understanding of and has agreed to the management plan. Patient is aware to call the clinic if symptoms persist or worsen. Patient is aware when to return to the clinic for a follow-up visit. Patient educated on when it is appropriate to go to the emergency department.   Time call ended: 12:41   I provided 11 minutes of  non face-to-face time during this encounter.    Ivy Lynn, NP

## 2021-11-27 ENCOUNTER — Ambulatory Visit: Payer: Commercial Managed Care - PPO | Admitting: Nurse Practitioner

## 2021-11-30 ENCOUNTER — Other Ambulatory Visit: Payer: Self-pay | Admitting: Nurse Practitioner

## 2021-11-30 DIAGNOSIS — F411 Generalized anxiety disorder: Secondary | ICD-10-CM

## 2021-12-21 ENCOUNTER — Other Ambulatory Visit: Payer: Self-pay | Admitting: Nurse Practitioner

## 2021-12-21 DIAGNOSIS — F5104 Psychophysiologic insomnia: Secondary | ICD-10-CM

## 2021-12-31 ENCOUNTER — Other Ambulatory Visit: Payer: Self-pay | Admitting: Nurse Practitioner

## 2021-12-31 DIAGNOSIS — F411 Generalized anxiety disorder: Secondary | ICD-10-CM

## 2022-01-03 ENCOUNTER — Encounter: Payer: Self-pay | Admitting: Nurse Practitioner

## 2022-01-03 ENCOUNTER — Ambulatory Visit: Payer: BC Managed Care – PPO | Admitting: Nurse Practitioner

## 2022-01-03 VITALS — BP 118/76 | HR 108 | Temp 97.9°F | Resp 20 | Ht 68.0 in | Wt 149.0 lb

## 2022-01-03 DIAGNOSIS — F902 Attention-deficit hyperactivity disorder, combined type: Secondary | ICD-10-CM | POA: Diagnosis not present

## 2022-01-03 MED ORDER — AMPHETAMINE-DEXTROAMPHET ER 30 MG PO CP24: 60.0000 mg | ORAL_CAPSULE | ORAL | 0 refills | Status: DC

## 2022-01-03 MED ORDER — AMPHETAMINE-DEXTROAMPHET ER 30 MG PO CP24: 60.0000 mg | ORAL_CAPSULE | Freq: Every day | ORAL | 0 refills | Status: DC

## 2022-01-03 NOTE — Patient Instructions (Signed)

## 2022-01-03 NOTE — Progress Notes (Signed)
Subjective:    Patient ID: Ruth Petty, female    DOB: [birth year], 26 y.o.   MRN: [id number]   Chief Complaint: ADD follow up  HPI Patient comes in today for follow up of ADD. She has hadthis since middle school. Has lots of trouble concentrating. 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 She is currently taking adderall XR 30mg  daily and is doing well with no heart rate and it has been running high.    Review of Systems  Constitutional:  Negative for diaphoresis.  Eyes:  Negative for pain.  Respiratory:  Negative for shortness of breath.   Cardiovascular:  Negative for chest pain, palpitations and leg swelling.  Gastrointestinal:  Negative for abdominal pain.  Endocrine: Negative for polydipsia.  Skin:  Negative for rash.  Neurological:  Negative for dizziness, weakness and headaches.  Hematological:  Does not bruise/bleed easily.  All other systems reviewed and are negative.      Objective:   Physical Exam Vitals and nursing note reviewed.  Constitutional:      General: She is not in acute distress.    Appearance: Normal appearance. She is well-developed.  Neck:     Vascular: No carotid bruit or JVD.  Cardiovascular:     Rate and Rhythm: Normal rate and regular rhythm.     Heart sounds: Normal heart sounds.  Pulmonary:     Effort: Pulmonary effort is normal. No respiratory distress.     Breath sounds: Normal breath sounds. No wheezing or rales.  Chest:     Chest wall: No tenderness.  Abdominal:     General: Bowel sounds are normal. There is no distension or abdominal bruit.     Palpations: Abdomen is soft. There is no hepatomegaly, splenomegaly, mass or pulsatile mass.     Tenderness: There is no abdominal tenderness.  Musculoskeletal:        General: Normal range of motion.     Cervical back: Normal range of motion and neck supple.  Lymphadenopathy:     Cervical: No cervical adenopathy.  Skin:    General: Skin is warm and  dry.  Neurological:     Mental Status: She is alert and oriented to person, place, and time.     Deep Tendon Reflexes: Reflexes are normal and symmetric.  Psychiatric:        Behavior: Behavior normal.        Thought Content: Thought content normal.        Judgment: Judgment normal.    BP 118/76   Pulse (!) 108   Temp 97.9 F (36.6 C) (Temporal)   Resp 20   Ht 5\' 8"  (1.727 m)   Wt 149 lb (67.6 kg)   SpO2 98%   BMI 22.66 kg/m         Assessment & Plan:   in today with chief complaint of ADHD   1. Attention deficit hyperactivity disorder (ADHD), combined type Stress management Hold adderall for the next few days and keep diary  of heart rate and let me know how it is running. - amphetamine-dextroamphetamine (ADDERALL XR) 30 MG 24 hr capsule; Take 2 capsules (60 mg total) by mouth every morning. Take  1 capsule at 7am and one at 1pm daily  Dispense: 60 capsule; Refill: 0 - amphetamine-dextroamphetamine (ADDERALL XR) 30 MG 24 hr capsule; Take 2 capsules (60 mg total) by mouth every morning. Take  1 capsule at 7am and  one at 1pm daily  Dispense: 60 capsule; Refill: 0 - amphetamine-dextroamphetamine (ADDERALL XR) 30 MG 24 hr capsule; Take 2 capsules (60 mg total) by mouth daily. Take 1 capsule by mouth at 7am and one at 1pm daily.  Dispense: 60 capsule; Refill: 0 - Ambulatory referral to Psychiatry    The above assessment and management plan was discussed with the patient. The patient verbalized understanding of and has agreed to the management plan. Patient is aware to call the clinic if symptoms persist or worsen. Patient is aware when to return to the clinic for a follow-up visit. Patient educated on when it is appropriate to go to the emergency department.   Mary-Margaret Daphine Deutscher, FNP

## 2022-01-24 ENCOUNTER — Other Ambulatory Visit: Payer: Self-pay | Admitting: Nurse Practitioner

## 2022-01-24 DIAGNOSIS — F331 Major depressive disorder, recurrent, moderate: Secondary | ICD-10-CM

## 2022-02-09 ENCOUNTER — Encounter: Payer: Self-pay | Admitting: Nurse Practitioner

## 2022-02-09 ENCOUNTER — Telehealth (INDEPENDENT_AMBULATORY_CARE_PROVIDER_SITE_OTHER): Payer: BC Managed Care – PPO | Admitting: Nurse Practitioner

## 2022-02-09 DIAGNOSIS — H1033 Unspecified acute conjunctivitis, bilateral: Secondary | ICD-10-CM

## 2022-02-09 MED ORDER — POLYMYXIN B-TRIMETHOPRIM 10000-0.1 UNIT/ML-% OP SOLN: 1.0000 [drp] | OPHTHALMIC | 0 refills | Status: DC

## 2022-02-09 NOTE — Patient Instructions (Signed)
Bacterial Conjunctivitis, Adult Bacterial conjunctivitis is an infection of your conjunctiva. This is the clear membrane that covers the white part of your eye and the inner part of your eyelid. This infection can make your eye: Red or pink. Itchy or irritated. This condition spreads easily from person to person (is contagious) and from one eye to the other eye. What are the causes? This condition is caused by germs (bacteria). You may get the infection if you come into close contact with: A person who has the infection. Items that have germs on them (are contaminated), such as face towels, contact lens solution, or eye makeup. What increases the risk? You are more likely to get this condition if: You have contact with people who have the infection. You wear contact lenses. You have a sinus infection. You have had a recent eye injury or surgery. You have a weak body defense system (immune system). You have dry eyes. What are the signs or symptoms?  Thick, yellowish discharge from the eye. Tearing or watery eyes. Itchy eyes. Burning feeling in your eyes. Eye redness. Swollen eyelids. Blurred vision. How is this treated?  Antibiotic eye drops or ointment. Antibiotic medicine taken by mouth. This is used for infections that do not get better with drops or ointment or that last more than 10 days. Cool, wet cloths placed on the eyes. Artificial tears used 2-6 times a day. Follow these instructions at home: Medicines Take or apply your antibiotic medicine as told by your doctor. Do not stop using it even if you start to feel better. Take or apply over-the-counter and prescription medicines only as told by your doctor. Do not touch your eyelid with the eye-drop bottle or the ointment tube. Managing discomfort Wipe any fluid from your eye with a warm, wet washcloth or a cotton ball. Place a clean, cool, wet cloth on your eye. Do this for 10-20 minutes, 3-4 times a day. General  instructions Do not wear contacts until the infection is gone. Wear glasses until your doctor says it is okay to wear contacts again. Do not wear eye makeup until the infection is gone. Throw away old eye makeup. Change or wash your pillowcase every day. Do not share towels or washcloths. Wash your hands often with soap and water for at least 20 seconds and especially before touching your face or eyes. Use paper towels to dry your hands. Do not touch or rub your eyes. Do not drive or use heavy machinery if your vision is blurred. Contact a doctor if: You have a fever. You do not get better after 10 days. Get help right away if: You have a fever and your symptoms get worse all of a sudden. You have very bad pain when you move your eye. Your face: Hurts. Is red. Is swollen. You have sudden loss of vision. Summary Bacterial conjunctivitis is an infection of your conjunctiva. This infection spreads easily from person to person. Wash your hands often with soap and water for at least 20 seconds and especially before touching your face or eyes. Use paper towels to dry your hands. Take or apply your antibiotic medicine as told by your doctor. Contact a doctor if you have a fever or you do not get better after 10 days. This information is not intended to replace advice given to you by your health care provider. Make sure you discuss any questions you have with your health care provider. Document Revised: 04/06/2020 Document Reviewed: 04/06/2020 Elsevier Patient Education    2023 [employer]  

## 2022-02-09 NOTE — Progress Notes (Signed)
Virtual Visit Consent   Ruth Petty, you are scheduled for a virtual visit with Mary-Margaret Hassell Done, [place], a [facility] provider, today.     Just as with appointments in the office, your consent must be obtained to participate.  Your consent will be active for this visit and any virtual visit you may have with one of our providers in the next 365 days.     If you have a MyChart account, a copy of this consent can be sent to you electronically.  All virtual visits are billed to your insurance company just like a traditional visit in the office.    As this is a virtual visit, video technology does not allow for your provider to perform a traditional examination.  This may limit your provider's ability to fully assess your condition.  If your provider identifies any concerns that need to be evaluated in person or the need to arrange testing (such as labs, EKG, etc.), we will make arrangements to do so.     Although advances in technology are sophisticated, we cannot ensure that it will always work on either your end or our end.  If the connection with a video visit is poor, the visit may have to be switched to a telephone visit.  With either a video or telephone visit, we are not always able to ensure that we have a secure connection.     I need to obtain your verbal consent now.   Are you willing to proceed with your visit today? YES   Ruth Petty has provided verbal consent on 02/09/2022 for a virtual visit (video or telephone).   Mary-Margaret Hassell Done, FNP   Date: 02/09/2022 4:00 PM   Virtual Visit via Video Note   I, Mary-Margaret Hassell Done, connected with Ruth Petty ([id number], [birth year]) on 02/09/22 at  6:00 PM EST by a video-enabled telemedicine application and verified that I am speaking with the correct person using two identifiers.  Location: Patient: Virtual Visit Location Patient: Home Provider: Virtual Visit Location Provider: Mobile   I discussed the  limitations of evaluation and management by telemedicine and the availability of in person appointments. The patient expressed understanding and agreed to proceed.    History of Present Illness: Ruth Petty is a 27 y.o. who identifies as a female who was assigned female at birth, and is being seen today for conunctivitis.  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  Conjunctivitis  The current episode started today. The onset was sudden. The problem occurs continuously. The problem has been rapidly worsening. The problem is moderate. Associated symptoms include double vision, congestion, eye discharge, eye pain and eye redness. Pertinent negatives include no decreased vision and no photophobia.    Review of Systems  HENT:  Positive for congestion.   Eyes:  Positive for double vision, pain, discharge and redness. Negative for photophobia.    Problems:  Patient Active Problem List   Diagnosis Date Noted   Depression 08/10/2015   Generalized anxiety disorder 08/10/2015   ADD (attention deficit disorder) 04/24/2012    Allergies: No Known Allergies Medications:  Current Outpatient Medications:    amphetamine-dextroamphetamine (ADDERALL XR) 30 MG 24 hr capsule, Take 2 capsules (60 mg total) by mouth every morning. Take  1 capsule at 7am and one at 1pm daily, Disp: 60 capsule, Rfl: 0   amphetamine-dextroamphetamine (ADDERALL XR) 30 MG 24 hr capsule, Take 2 capsules (60 mg total)  by mouth every morning. Take  1 capsule at 7am and one at 1pm daily, Disp: 60 capsule, Rfl: 0   [START ON 03/04/2022] amphetamine-dextroamphetamine (ADDERALL XR) 30 MG 24 hr capsule, Take 2 capsules (60 mg total) by mouth daily. Take 1 capsule by mouth at 7am and one at 1pm daily., Disp: 60 capsule, Rfl: 0   busPIRone (BUSPAR) 15 MG tablet, Take 1 tablet (15 mg total) by mouth 2 (two) times daily. (NEEDS TO BE SEEN BEFORE NEXT REFILL), Disp: 60 tablet, Rfl: 0    escitalopram (LEXAPRO) 20 MG tablet, TAKE 1 TABLET BY MOUTH EVERY DAY, Disp: 90 tablet, Rfl: 1   JUNEL 1.5/30 1.5-30 MG-MCG tablet, Take 1 tablet by mouth daily., Disp: , Rfl: 4   mupirocin ointment (BACTROBAN) 2 %, Apply to superficial cyst daily (Patient not taking: Reported on 01/03/2022), Disp: 22 g, Rfl: 0   omeprazole (PRILOSEC) 20 MG capsule, Take 1 capsule (20 mg total) by mouth daily. (Patient not taking: Reported on 01/03/2022), Disp: 30 capsule, Rfl: 3   Tazarotene (FABIOR) 0.1 % FOAM, Apply 1 application topically daily., Disp: 50 g, Rfl: 6   traZODone (DESYREL) 50 MG tablet, TAKE ONE TABLET AT BEDTIME, Disp: 90 tablet, Rfl: 0  Observations/Objective: Patient is well-developed, well-nourished in no acute distress.  Resting comfortably  at home.  Head is normocephalic, atraumatic.  No labored breathing.  Speech is clear and coherent with logical content.  Patient is alert and oriented at baseline.  Bil scleral injection- yellow exudate in bil inner canthus  Assessment and Plan:  Ruth Petty in today with chief complaint of Conjunctivitis   1. Acute bacterial conjunctivitis of both eyes Warm compresses Good handwashing  Meds ordered this encounter  Medications   trimethoprim-polymyxin b (POLYTRIM) ophthalmic solution    Sig: Place 1 drop into both eyes every 4 (four) hours.    Dispense:  10 mL    Refill:  0    Order Specific Question:   Supervising Provider    Answer:   Caryl Pina A [[id number]]      Follow Up Instructions: I discussed the assessment and treatment plan with the patient. The patient was provided an opportunity to ask questions and all were answered. The patient agreed with the plan and demonstrated an understanding of the instructions.  A copy of instructions were sent to the patient via MyChart.  The patient was advised to call back or seek an in-person evaluation if the symptoms worsen or if the condition fails to improve as  anticipated.  Time:  I spent 5 minutes with the patient via telehealth technology discussing the above problems/concerns.    Mary-Margaret Hassell Done, FNP

## 2022-02-23 ENCOUNTER — Other Ambulatory Visit: Payer: Self-pay | Admitting: Nurse Practitioner

## 2022-02-23 NOTE — Telephone Encounter (Signed)
Pt believes she got pink eye again while at work. 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 Wants refill on pink eye medicine.

## 2022-04-02 ENCOUNTER — Other Ambulatory Visit: Payer: Self-pay | Admitting: Nurse Practitioner

## 2022-04-02 NOTE — Telephone Encounter (Signed)
Patient says she has pink eye again and needs her eye drops refilled.

## 2022-04-03 ENCOUNTER — Other Ambulatory Visit: Payer: Self-pay | Admitting: Nurse Practitioner

## 2022-04-03 MED ORDER — POLYMYXIN B-TRIMETHOPRIM 10000-0.1 UNIT/ML-% OP SOLN: OPHTHALMIC | 0 refills | Status: DC

## 2022-04-03 NOTE — Progress Notes (Signed)
Eye dropsrefilled Meds ordered this encounter  Medications   trimethoprim-polymyxin b (POLYTRIM) ophthalmic solution    Sig: INSTILL 1 DROP INTO BOTH EYES EVERY 4 HOURS    Dispense:  10 mL    Refill:  0    Order Specific Question:   Supervising Provider    Answer:   Worthy Rancher [id number]   [place], FNP

## 2022-04-03 NOTE — Telephone Encounter (Signed)
Notified patient.

## 2022-04-09 ENCOUNTER — Ambulatory Visit: Payer: BC Managed Care – PPO | Admitting: Nurse Practitioner

## 2022-04-09 ENCOUNTER — Encounter: Payer: Self-pay | Admitting: Nurse Practitioner

## 2022-04-09 VITALS — BP 115/74 | HR 96 | Temp 97.8°F | Resp 20 | Ht 68.0 in | Wt 153.0 lb

## 2022-04-09 DIAGNOSIS — F331 Major depressive disorder, recurrent, moderate: Secondary | ICD-10-CM

## 2022-04-09 DIAGNOSIS — F902 Attention-deficit hyperactivity disorder, combined type: Secondary | ICD-10-CM

## 2022-04-09 MED ORDER — AMPHETAMINE-DEXTROAMPHET ER 30 MG PO CP24: 60.0000 mg | ORAL_CAPSULE | ORAL | 0 refills | Status: DC

## 2022-04-09 MED ORDER — AMPHETAMINE-DEXTROAMPHET ER 30 MG PO CP24: 60.0000 mg | ORAL_CAPSULE | Freq: Every day | ORAL | 0 refills | Status: DC

## 2022-04-09 NOTE — Progress Notes (Signed)
Subjective:    Patient ID: Ruth Petty, female    DOB: 1995-08-02, 27 y.o.   MRN: [id number]   Chief Complaint: No chief complaint on file.    HPI:  Ruth Petty is a 27 y.o. who identifies as a female who was assigned female at birth.   Social history: Lives with: mom and dad Work history: Oncologist   Comes in today for follow up of the following chronic medical issues:  1. Attention deficit hyperactivity disorder (ADHD), combined type Patient has had issues with concentration since she was in middle school. 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 She is currently on adderall XR 30mg  daily. No medication side effects.  2. Moderate episode of recurrent major depressive disorder Is on lexapro and is doing well.    04/09/2022    4:25 PM 01/03/2022    3:50 PM 09/14/2021    3:55 PM  Depression screen PHQ 2/9  Decreased Interest 1 1 1   Down, Depressed, Hopeless 1 1 1   PHQ - 2 Score 2 2 2   Altered sleeping 0 0 0  Tired, decreased energy 1 0 1  Change in appetite 0 1 0  Feeling bad or failure about yourself  1 1 1   Trouble concentrating 0 0 0  Moving slowly or fidgety/restless 0 0 0  Suicidal thoughts 0 0 0  PHQ-9 Score 4 4 4   Difficult doing work/chores Somewhat difficult Somewhat difficult Somewhat difficult    3. Anxiety Has buspar to take as needed.    04/09/2022    4:25 PM 01/03/2022    3:50 PM 09/14/2021    3:55 PM 08/22/2021    2:51 PM  GAD 7 : Generalized Anxiety Score  Nervous, Anxious, on Edge 1 1 1 1   Control/stop worrying 1 1 1 1   Worry too much - different things 1 1 1 1   Trouble relaxing 1 1 1 1   Restless 0 0 0 0  Easily annoyed or irritable 1 1 1 1   Afraid - awful might happen 1 1 1 1   Total GAD 7 Score 6 6 6 6   Anxiety Difficulty Somewhat difficult Somewhat difficult Somewhat difficult Somewhat difficult      4. Insomnia Has trazadone to sleep. Sleep about 7-8 hours. She only takes 1/2 tablet on  Sunday nights  5. GERD Takes omeprazole as needed  New complaints: None today  No Known Allergies Outpatient Encounter Medications as of 04/09/2022  Medication Sig   amphetamine-dextroamphetamine (ADDERALL XR) 30 MG 24 hr capsule Take 2 capsules (60 mg total) by mouth every morning. Take  1 capsule at 7am and one at 1pm daily   amphetamine-dextroamphetamine (ADDERALL XR) 30 MG 24 hr capsule Take 2 capsules (60 mg total) by mouth every morning. Take  1 capsule at 7am and one at 1pm daily   amphetamine-dextroamphetamine (ADDERALL XR) 30 MG 24 hr capsule Take 2 capsules (60 mg total) by mouth daily. Take 1 capsule by mouth at 7am and one at 1pm daily.   busPIRone (BUSPAR) 15 MG tablet Take 1 tablet (15 mg total) by mouth 2 (two) times daily. (NEEDS TO BE SEEN BEFORE NEXT REFILL)   escitalopram (LEXAPRO) 20 MG tablet TAKE 1 TABLET BY MOUTH EVERY DAY   JUNEL 1.5/30 1.5-30 MG-MCG tablet Take 1 tablet by mouth daily.   mupirocin ointment (BACTROBAN) 2 % Apply to superficial cyst daily (Patient not taking: Reported on 01/03/2022)   omeprazole (PRILOSEC) 20 MG  capsule Take 1 capsule (20 mg total) by mouth daily. (Patient not taking: Reported on 01/03/2022)   Tazarotene (FABIOR) 0.1 % FOAM Apply 1 application topically daily.   traZODone (DESYREL) 50 MG tablet TAKE ONE TABLET AT BEDTIME   trimethoprim-polymyxin b (POLYTRIM) ophthalmic solution INSTILL 1 DROP INTO BOTH EYES EVERY 4 HOURS   No facility-administered encounter medications on file as of 04/09/2022.    History reviewed. No pertinent surgical history.  History reviewed. No pertinent family history.    Controlled substance contract: 04/09/22     Review of Systems  Constitutional:  Negative for diaphoresis.  Eyes:  Negative for pain.  Respiratory:  Negative for shortness of breath.   Cardiovascular:  Negative for chest pain, palpitations and leg swelling.  Gastrointestinal:  Negative for abdominal pain.  Endocrine: Negative for  polydipsia.  Skin:  Negative for rash.  Allergic/Immunologic: Negative for environmental allergies.  Neurological:  Negative for dizziness, weakness and headaches.  Hematological:  Does not bruise/bleed easily.  All other systems reviewed and are negative.      Objective:   Physical Exam Vitals and nursing note reviewed.  Constitutional:      General: She is not in acute distress.    Appearance: Normal appearance. She is well-developed.  HENT:     Head: Normocephalic.  Neck:     Vascular: No carotid bruit or JVD.  Cardiovascular:     Rate and Rhythm: Normal rate and regular rhythm.     Heart sounds: Normal heart sounds.  Pulmonary:     Effort: Pulmonary effort is normal. No respiratory distress.     Breath sounds: Normal breath sounds. No wheezing or rales.  Chest:     Chest wall: No tenderness.  Abdominal:     General: Bowel sounds are normal. There is no distension or abdominal bruit.     Palpations: Abdomen is soft. There is no hepatomegaly, splenomegaly, mass or pulsatile mass.     Tenderness: There is no abdominal tenderness.  Musculoskeletal:        General: Normal range of motion.     Cervical back: Normal range of motion and neck supple.  Lymphadenopathy:     Cervical: No cervical adenopathy.  Skin:    General: Skin is warm and dry.  Neurological:     Mental Status: She is alert and oriented to person, place, and time.     Deep Tendon Reflexes: Reflexes are normal and symmetric.  Psychiatric:        Behavior: Behavior normal.        Thought Content: Thought content normal.        Judgment: Judgment normal.    BP 115/74   Pulse 96   Temp 97.8 F (36.6 C) (Temporal)   Resp 20   Ht 5\' 8"  (1.727 m)   Wt 153 lb (69.4 kg)   SpO2 97%   BMI 23.26 kg/m         Assessment & Plan:  Ruth Petty in today with chief complaint of ADHD   1. Attention deficit hyperactivity disorder (ADHD), combined type Stress management - ToxASSURE Select 13 (MW),  Urine - amphetamine-dextroamphetamine (ADDERALL XR) 30 MG 24 hr capsule; Take 2 capsules (60 mg total) by mouth every morning. Take  1 capsule at 7am and one at 1pm daily  Dispense: 60 capsule; Refill: 0 - amphetamine-dextroamphetamine (ADDERALL XR) 30 MG 24 hr capsule; Take 2 capsules (60 mg total) by mouth daily. Take 1 capsule by mouth at 7am and  one at 1pm daily.  Dispense: 60 capsule; Refill: 0 - amphetamine-dextroamphetamine (ADDERALL XR) 30 MG 24 hr capsule; Take 2 capsules (60 mg total) by mouth every morning. Take  1 capsule at 7am and one at 1pm daily  Dispense: 60 capsule; Refill: 0  2. Moderate episode of recurrent major depressive disorder Continue lexapro as prescribed    The above assessment and management plan was discussed with the patient. The patient verbalized understanding of and has agreed to the management plan. Patient is aware to call the clinic if symptoms persist or worsen. Patient is aware when to return to the clinic for a follow-up visit. Patient educated on when it is appropriate to go to the emergency department.   Mary-Margaret Hassell Done, FNP

## 2022-04-12 LAB — TOXASSURE SELECT 13 (MW), URINE

## 2022-04-13 ENCOUNTER — Telehealth: Payer: Self-pay | Admitting: Nurse Practitioner

## 2022-04-16 ENCOUNTER — Telehealth: Payer: Self-pay

## 2022-04-16 ENCOUNTER — Other Ambulatory Visit (HOSPITAL_COMMUNITY): Payer: Self-pay

## 2022-04-16 NOTE — Telephone Encounter (Signed)
Pharmacy Patient Advocate Encounter   Received notification that prior authorization for Amphetamine-Dextroamphet ER 30MG  capsules is required/requested.  Per Test Claim: prior authorization required   PA submitted on 04/16/22 to (ins) [employer] via CoverMyMeds Key  # [id number] Status is pending

## 2022-04-18 NOTE — Telephone Encounter (Signed)
Patient Advocate Encounter  Prior Authorization for Amphetamine-Dextroamphet ER 30MG  has been approved.    PA# PA Case ID #: [id number] Effective dates: 04/17/22 through 04/14/25

## 2022-04-19 ENCOUNTER — Other Ambulatory Visit (HOSPITAL_COMMUNITY): Payer: Self-pay

## 2022-04-19 NOTE — Telephone Encounter (Signed)
Patient Advocate Encounter  Prior Authorization for Amphetamine-Dextroamphet ER 30MG  er capsules has been approved.    PA# [id number] Effective dates: 04/16/22 through 04/15/25

## 2022-07-03 ENCOUNTER — Encounter: Payer: Self-pay | Admitting: Nurse Practitioner

## 2022-07-03 ENCOUNTER — Ambulatory Visit: Payer: BC Managed Care – PPO | Admitting: Nurse Practitioner

## 2022-07-03 VITALS — BP 110/72 | HR 112 | Temp 98.2°F | Resp 20 | Ht 68.0 in | Wt 153.0 lb

## 2022-07-03 DIAGNOSIS — F902 Attention-deficit hyperactivity disorder, combined type: Secondary | ICD-10-CM

## 2022-07-03 DIAGNOSIS — F331 Major depressive disorder, recurrent, moderate: Secondary | ICD-10-CM

## 2022-07-03 DIAGNOSIS — F411 Generalized anxiety disorder: Secondary | ICD-10-CM

## 2022-07-03 MED ORDER — ESCITALOPRAM OXALATE 20 MG PO TABS
20.0000 mg | ORAL_TABLET | Freq: Every day | ORAL | 1 refills | Status: DC
Start: 2022-07-03 — End: 2023-02-07

## 2022-07-03 MED ORDER — AMPHETAMINE-DEXTROAMPHET ER 30 MG PO CP24: 60.0000 mg | ORAL_CAPSULE | ORAL | 0 refills | Status: DC

## 2022-07-03 MED ORDER — BUSPIRONE HCL 15 MG PO TABS
15.0000 mg | ORAL_TABLET | Freq: Two times a day (BID) | ORAL | 0 refills | Status: DC
Start: 2022-07-03 — End: 2022-08-01

## 2022-07-03 MED ORDER — AMPHETAMINE-DEXTROAMPHET ER 30 MG PO CP24: 60.0000 mg | ORAL_CAPSULE | Freq: Every day | ORAL | 0 refills | Status: DC

## 2022-07-03 NOTE — Progress Notes (Signed)
Subjective:    Patient ID: Ruth Petty, female    DOB: Sep 26, 1995, 27 y.o.   MRN: [id number]   Chief Complaint: medical management of chronic issues    ARNITRA SOKOLOSKI in today with chief complaint of ADHD   1. Attention deficit hyperactivity disorder (ADHD), combined type Patient has been on adhd meds since high school. 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 She is on adderall 30mg  daily and is doing well.  2. Moderate episode of recurrent major depressive disorder (HCC) Is on lexapro and is doing well    07/03/2022    2:12 PM 04/09/2022    4:25 PM 01/03/2022    3:50 PM  Depression screen PHQ 2/9  Decreased Interest 0 1 1  Down, Depressed, Hopeless 1 1 1   PHQ - 2 Score 1 2 2   Altered sleeping 1 0 0  Tired, decreased energy 1 1 0  Change in appetite 0 0 1  Feeling bad or failure about yourself  0 1 1  Trouble concentrating 0 0 0  Moving slowly or fidgety/restless 0 0 0  Suicidal thoughts 0 0 0  PHQ-9 Score 3 4 4   Difficult doing work/chores Somewhat difficult Somewhat difficult Somewhat difficult     3. Generalized anxiety disorder Take buspar as needed.    07/03/2022    2:13 PM 04/09/2022    4:25 PM 01/03/2022    3:50 PM 09/14/2021    3:55 PM  GAD 7 : Generalized Anxiety Score  Nervous, Anxious, on Edge 1 1 1 1   Control/stop worrying 1 1 1 1   Worry too much - different things 1 1 1 1   Trouble relaxing 1 1 1 1   Restless 0 0 0 0  Easily annoyed or irritable 1 1 1 1   Afraid - awful might happen 1 1 1 1   Total GAD 7 Score 6 6 6 6   Anxiety Difficulty Somewhat difficult Somewhat difficult Somewhat difficult Somewhat difficult     4. 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 Has been sleeping well. Does not take meds every night    Patient Active Problem List   Diagnosis Date Noted   Depression 08/10/2015   Generalized anxiety disorder 08/10/2015   ADD (attention deficit disorder) 04/24/2012       Review of  Systems  Constitutional:  Negative for diaphoresis.  Eyes:  Negative for pain.  Respiratory:  Negative for shortness of breath.   Cardiovascular:  Negative for chest pain, palpitations and leg swelling.  Gastrointestinal:  Negative for abdominal pain.  Endocrine: Negative for polydipsia.  Skin:  Negative for rash.  Neurological:  Negative for dizziness, weakness and headaches.  Hematological:  Does not bruise/bleed easily.  All other systems reviewed and are negative.      Objective:   Physical Exam Vitals and nursing note reviewed.  Constitutional:      General: She is not in acute distress.    Appearance: Normal appearance. She is well-developed.  Neck:     Vascular: No carotid bruit or JVD.  Cardiovascular:     Rate and Rhythm: Normal rate and regular rhythm.     Heart sounds: Normal heart sounds.  Pulmonary:     Effort: Pulmonary effort is normal. No respiratory distress.     Breath sounds: Normal breath sounds. No wheezing or rales.  Chest:     Chest wall: No tenderness.  Abdominal:     General: Bowel sounds are normal. There is no  distension or abdominal bruit.     Palpations: Abdomen is soft. There is no hepatomegaly, splenomegaly, mass or pulsatile mass.     Tenderness: There is no abdominal tenderness.  Musculoskeletal:        General: Normal range of motion.     Cervical back: Normal range of motion and neck supple.  Lymphadenopathy:     Cervical: No cervical adenopathy.  Skin:    General: Skin is warm and dry.  Neurological:     Mental Status: She is alert and oriented to person, place, and time.     Deep Tendon Reflexes: Reflexes are normal and symmetric.  Psychiatric:        Behavior: Behavior normal.        Thought Content: Thought content normal.        Judgment: Judgment normal.    BP 110/72   Pulse (!) 112   Temp 98.2 F (36.8 C) (Temporal)   Resp 20   Ht 5\' 8"  (1.727 m)   Wt 153 lb (69.4 kg)   SpO2 99%   BMI 23.26 kg/m          Assessment & Plan:  Ruth Petty in today with chief complaint of ADHD   1. Attention deficit hyperactivity disorder (ADHD), combined type - amphetamine-dextroamphetamine (ADDERALL XR) 30 MG 24 hr capsule; Take 2 capsules (60 mg total) by mouth daily. Take 1 capsule by mouth at 7am and one at 1pm daily.  Dispense: 60 capsule; Refill: 0 - amphetamine-dextroamphetamine (ADDERALL XR) 30 MG 24 hr capsule; Take 2 capsules (60 mg total) by mouth every morning. Take  1 capsule at 7am and one at 1pm daily  Dispense: 60 capsule; Refill: 0 - amphetamine-dextroamphetamine (ADDERALL XR) 30 MG 24 hr capsule; Take 2 capsules (60 mg total) by mouth every morning. Take  1 capsule at 7am and one at 1pm daily  Dispense: 60 capsule; Refill: 0  2. Moderate episode of recurrent major depressive disorder (HCC) - escitalopram (LEXAPRO) 20 MG tablet; Take 1 tablet (20 mg total) by mouth daily.  Dispense: 90 tablet; Refill: 1  3. Generalized anxiety disorder - busPIRone (BUSPAR) 15 MG tablet; Take 1 tablet (15 mg total) by mouth 2 (two) times daily. (NEEDS TO BE SEEN BEFORE NEXT REFILL)  Dispense: 60 tablet; Refill: 0  Stress management  The above assessment and management plan was discussed with the patient. The patient verbalized understanding of and has agreed to the management plan. Patient is aware to call the clinic if symptoms persist or worsen. Patient is aware when to return to the clinic for a follow-up visit. Patient educated on when it is appropriate to go to the emergency department.   Mary-Margaret Daphine Deutscher, FNP

## 2022-08-01 ENCOUNTER — Other Ambulatory Visit: Payer: Self-pay | Admitting: Nurse Practitioner

## 2022-08-01 DIAGNOSIS — F411 Generalized anxiety disorder: Secondary | ICD-10-CM

## 2022-08-10 ENCOUNTER — Ambulatory Visit: Payer: BC Managed Care – PPO | Admitting: Family Medicine

## 2022-08-10 ENCOUNTER — Encounter: Payer: Self-pay | Admitting: Family Medicine

## 2022-08-10 VITALS — BP 83/54 | HR 133 | Temp 98.7°F | Ht 68.0 in | Wt 154.0 lb

## 2022-08-10 DIAGNOSIS — J029 Acute pharyngitis, unspecified: Secondary | ICD-10-CM

## 2022-08-10 DIAGNOSIS — F411 Generalized anxiety disorder: Secondary | ICD-10-CM

## 2022-08-10 DIAGNOSIS — R000 Tachycardia, unspecified: Secondary | ICD-10-CM

## 2022-08-10 DIAGNOSIS — I959 Hypotension, unspecified: Secondary | ICD-10-CM | POA: Diagnosis not present

## 2022-08-10 LAB — RAPID STREP SCREEN (MED CTR MEBANE ONLY): Strep Gp A Ag, IA W/Reflex: NEGATIVE

## 2022-08-10 LAB — CULTURE, GROUP A STREP

## 2022-08-10 NOTE — Progress Notes (Signed)
Acute Office Visit  Subjective:  Patient ID: Ruth Petty, female    DOB: 09-21-1995, 27 y.o.   MRN: [id number]  Chief Complaint  Patient presents with   Sore Throat   Sore Throat    Patient is in today for sore throat. Reports that symptoms started yesterday afternoon with irritation while she swallowed. Started yesterday suddenly. Last night she had chills, fever of 101, and body aches. Did have one episode of vomiting. Took tylenol last night. Has not taken any since 6 am. Denies runny noses, congestion, sinus pressure, ear pressure, and cough.  Exposed to niece who is 4 who had 24 hour illness with GI distress   ROS As per HPI   Objective:  BP (!) 83/54   Pulse (!) 133   Temp 98.7 F (37.1 C)   Ht 5\' 8"  (1.727 m)   Wt 154 lb (69.9 kg)   LMP 07/24/2022 (Exact Date)   SpO2 98%   BMI 23.42 kg/m   Physical Exam Constitutional:      General: She is awake. She is not in acute distress.    Appearance: Normal appearance. She is well-developed and well-groomed. She is ill-appearing. She is not toxic-appearing or diaphoretic.  Cardiovascular:     Rate and Rhythm: Tachycardia present.     Pulses: Normal pulses.          Radial pulses are 2+ on the right side and 2+ on the left side.       Posterior tibial pulses are 2+ on the right side and 2+ on the left side.     Heart sounds: Normal heart sounds. No murmur heard.    No gallop.  Pulmonary:     Effort: Pulmonary effort is normal. No tachypnea, bradypnea, accessory muscle usage, prolonged expiration, respiratory distress or retractions.     Breath sounds: Normal breath sounds. No stridor. No wheezing, rhonchi or rales.  Abdominal:     Palpations: Abdomen is soft.  Musculoskeletal:     Cervical back: Full passive range of motion without pain and neck supple.     Right lower leg: No edema.     Left lower leg: No edema.  Skin:    General: Skin is warm.     Capillary Refill: Capillary refill takes less than 2 seconds.   Neurological:     General: No focal deficit present.     Mental Status: She is alert, oriented to person, place, and time and easily aroused. Mental status is at baseline.     GCS: GCS eye subscore is 4. GCS verbal subscore is 5. GCS motor subscore is 6.     Motor: No weakness.  Psychiatric:        Attention and Perception: Attention and perception normal.        Mood and Affect: Mood is anxious.        Speech: Speech normal.        Behavior: Behavior normal. Behavior is cooperative.        Thought Content: Thought content normal. Thought content does not include homicidal or suicidal ideation. Thought content does not include homicidal or suicidal plan.        Cognition and Memory: Cognition and memory normal.        Judgment: Judgment normal.       08/10/2022   10:11 AM 07/03/2022    2:13 PM 04/09/2022    4:25 PM 01/03/2022    3:50 PM  GAD 7 : Generalized Anxiety Score  Nervous, Anxious, on Edge 1 1 1 1   Control/stop worrying 1 1 1 1   Worry too much - different things 2 1 1 1   Trouble relaxing 2 1 1 1   Restless 2 0 0 0  Easily annoyed or irritable 1 1 1 1   Afraid - awful might happen 0 1 1 1   Total GAD 7 Score 9 6 6 6   Anxiety Difficulty  Somewhat difficult Somewhat difficult Somewhat difficult       08/10/2022   10:11 AM 07/03/2022    2:12 PM 04/09/2022    4:25 PM  Depression screen PHQ 2/9  Decreased Interest 0 0 1  Down, Depressed, Hopeless 0 1 1  PHQ - 2 Score 0 1 2  Altered sleeping 0 1 0  Tired, decreased energy 0 1 1  Change in appetite 1 0 0  Feeling bad or failure about yourself  0 0 1  Trouble concentrating 1 0 0  Moving slowly or fidgety/restless 0 0 0  Suicidal thoughts 0 0 0  PHQ-9 Score 2 3 4   Difficult doing work/chores Not difficult at all Somewhat difficult Somewhat difficult   Assessment & Plan:  1. Sore throat Labs as below. Will communicate results to patient once available. Will await results to determine next steps.  - Rapid Strep Screen ([facility] ONLY); Future - COVID-19, Flu A+B and RSV - Rapid Strep Screen ([facility] ONLY) - Culture, Group A Strep; Future - Culture, Group A Strep  2. Hypotension, unspecified hypotension type Patient BP decreased today. Reviewed prior vitals signs, lower than normal. Patient expressed anxiety and that she needed to eat. Discussed with patient to increase hydration. Requested patient to repeat BP, patient left before BP could be taken. Discussed red flag symptoms with patient.   3. Generalized anxiety disorder Elevated GAD score today. Denies SI. Patient to follow up with PCP.   4. Tachycardia Elevated HR. Discussed with patient that likely elevated due to stimulant and illness. Patient has had elevated HR in the past. Has not completed evaluation. Declined further workup at this time. Patient to follow up with PCP if she would like additional evaluation of HR.   The above assessment and management plan was discussed with the patient. The patient verbalized understanding of and has agreed to the management plan using shared-decision making. Patient is aware to call the clinic if they develop any new symptoms or if symptoms fail to improve or worsen. Patient is aware when to return to the clinic for a follow-up visit. Patient educated on when it is appropriate to go to the emergency department.   Return in about 6 weeks (around 09/21/2022).  Neale Burly, DNP-FNP [facility] [address] [place], [place] [address] [phone]

## 2022-08-14 NOTE — Progress Notes (Signed)
Negative for all, covid, rsv, flu, and strep

## 2022-08-15 ENCOUNTER — Ambulatory Visit (INDEPENDENT_AMBULATORY_CARE_PROVIDER_SITE_OTHER): Payer: BC Managed Care – PPO | Admitting: Nurse Practitioner

## 2022-08-15 ENCOUNTER — Encounter: Payer: Self-pay | Admitting: Nurse Practitioner

## 2022-08-15 VITALS — BP 111/72 | HR 109 | Temp 97.9°F | Resp 20 | Ht 68.0 in | Wt 155.0 lb

## 2022-08-15 DIAGNOSIS — R000 Tachycardia, unspecified: Secondary | ICD-10-CM

## 2022-08-15 MED ORDER — NEBIVOLOL HCL 5 MG PO TABS: 5.0000 mg | ORAL_TABLET | Freq: Every day | ORAL | 3 refills | Status: DC

## 2022-08-15 NOTE — Patient Instructions (Signed)
Sinus Tachycardia  Sinus tachycardia is a fast heartbeat. In sinus tachycardia, the heart beats more than 100 times a minute. Sinus tachycardia starts in the part of the heart called the sinoatrial (SA) node. Sinus tachycardia may be harmless, or it may be a sign of a serious condition. What are the causes? This condition may be caused by: Exercise or exertion. A fever. Pain. Loss of body fluids (dehydration). Severe bleeding (hemorrhage). Anxiety and stress. Certain substances, including: Alcohol. Caffeine. Tobacco and nicotine products. Cold medicines. Illegal drugs. Medical conditions including: Heart disease. An infection. An overactive thyroid (hyperthyroidism). A lack of red blood cells (anemia). What are the signs or symptoms? Symptoms of this condition include: A feeling that the heart is beating fast or unevenly (palpitations). Suddenly noticing your heartbeat (cardiac awareness). Lightheadedness. Tiredness (fatigue). Shortness of breath. Chest pain. Nausea. Fainting. How is this diagnosed? This condition is diagnosed with: A physical exam. Tests or monitoring, such as: Blood tests. An electrocardiogram (ECG). This test measures the electrical activity of the heart. Ambulatory cardiac monitor. This records your heartbeats for 24 hours or more. You may be referred to a heart specialist (cardiologist). How is this treated? Treatment for this condition depends on the cause. Treatment may involve: Treating the underlying condition. Taking new medicines or changing your current medicines as told by your health care provider. Making changes to your diet or lifestyle. Follow these instructions at home: Lifestyle  Do not use any products that contain nicotine or tobacco. These products include cigarettes, chewing tobacco, and vaping devices, such as e-cigarettes. If you need help quitting, ask your health care provider. Do not use illegal drugs, such as  cocaine. Learn relaxation methods to help you when you get stressed or anxious. These include deep breathing. Avoid caffeine or other stimulants, including herbal stimulants that are found in energy drinks. Alcohol use  Do not drink alcohol if: Your health care provider tells you not to drink. You are pregnant, may be pregnant, or are planning to become pregnant. If you drink alcohol: Limit how much you have to: 0-1 drink a day for women. 0-2 drinks a day for men. Know how much alcohol is in your drink. In the [place] one drink equals one 12 oz bottle of beer (355 mL), one 5 oz glass of wine (148 mL), or one 1 oz glass of hard liquor (44 mL). General instructions Drink enough fluids to keep your urine pale yellow. Take over-the-counter and prescription medicines only as told by your health care provider. Ask your health care provider about taking vitamins, herbs, and supplements. Contact a health care provider if: You have vomiting or diarrhea that does not go away. You have a fever. You have weakness or dizziness. You feel faint. Get help right away if: You have pain in your chest, upper arms, jaw, or neck. You have palpitations that do not go away. Summary In sinus tachycardia, the heart beats more than 100 times a minute. Sinus tachycardia may be harmless, or it may be a sign of a serious condition. Treatment for this condition depends on the cause or the underlying condition. Get help right away if you have pain in your chest, upper arms, jaw, or neck. This information is not intended to replace advice given to you by your health care provider. Make sure you discuss any questions you have with your health care provider. Document Revised: 04/25/2021 Document Reviewed: 04/25/2021 Elsevier Patient Education  2024 [employer]  

## 2022-08-15 NOTE — Progress Notes (Signed)
   Subjective:    Patient ID: Ruth Petty, female    DOB: Jan 30, 1995, 27 y.o.   MRN: [id number]   Chief Complaint: tachycardia  HPI  Patient was seen last week with sore throat. While being seen her heart rate was 132. She says he does nit feel that it is racing. She did not take her adderall for a few days adhd heart rate is still elevated.  Pulse Readings from Last 3 Encounters:  08/15/22 (!) 109  08/10/22 (!) 133  07/03/22 (!) 112     Patient Active Problem List   Diagnosis Date Noted   Depression 08/10/2015   Generalized anxiety disorder 08/10/2015   ADD (attention deficit disorder) 04/24/2012       Review of Systems  Constitutional:  Negative for diaphoresis.  Eyes:  Negative for pain.  Respiratory:  Negative for shortness of breath.   Cardiovascular:  Negative for chest pain, palpitations and leg swelling.  Gastrointestinal:  Negative for abdominal pain.  Endocrine: Negative for polydipsia.  Skin:  Negative for rash.  Neurological:  Negative for dizziness, weakness and headaches.  Hematological:  Does not bruise/bleed easily.  All other systems reviewed and are negative.      Objective:   Physical Exam Constitutional:      Appearance: Normal appearance.  Cardiovascular:     Rate and Rhythm: Regular rhythm. Tachycardia present.     Heart sounds: Normal heart sounds.  Pulmonary:     Breath sounds: Normal breath sounds.  Skin:    General: Skin is warm.  Neurological:     General: No focal deficit present.     Mental Status: She is alert and oriented to person, place, and time.     BP 111/72   Pulse (!) 109   Temp 97.9 F (36.6 C) (Temporal)   Resp 20   Ht 5\' 8"  (1.727 m)   Wt 155 lb (70.3 kg)   LMP 07/24/2022 (Exact Date)   SpO2 96%   BMI 23.57 kg/m        Assessment & Plan:   Ruth Petty in today with chief complaint of Tachycardia   1. Tachycardia - EKG 12-Lead  2. Sinus tachycardia Avoid caffeine Keep check of heart  rate - nebivolol (BYSTOLIC) 5 MG tablet; Take 1 tablet (5 mg total) by mouth daily.  Dispense: 90 tablet; Refill: 3    The above assessment and management plan was discussed with the patient. The patient verbalized understanding of and has agreed to the management plan. Patient is aware to call the clinic if symptoms persist or worsen. Patient is aware when to return to the clinic for a follow-up visit. Patient educated on when it is appropriate to go to the emergency department.   Mary-Margaret Daphine Deutscher, FNP

## 2022-09-28 ENCOUNTER — Other Ambulatory Visit: Payer: Self-pay | Admitting: Nurse Practitioner

## 2022-10-01 ENCOUNTER — Ambulatory Visit: Payer: BC Managed Care – PPO | Admitting: Nurse Practitioner

## 2022-10-02 ENCOUNTER — Encounter: Payer: Self-pay | Admitting: Nurse Practitioner

## 2022-10-02 ENCOUNTER — Ambulatory Visit (INDEPENDENT_AMBULATORY_CARE_PROVIDER_SITE_OTHER): Payer: BC Managed Care – PPO | Admitting: Nurse Practitioner

## 2022-10-02 DIAGNOSIS — F902 Attention-deficit hyperactivity disorder, combined type: Secondary | ICD-10-CM | POA: Diagnosis not present

## 2022-10-02 MED ORDER — AMPHETAMINE-DEXTROAMPHET ER 30 MG PO CP24
60.0000 mg | ORAL_CAPSULE | Freq: Every day | ORAL | 0 refills | Status: DC
Start: 2022-12-05 — End: 2023-03-14

## 2022-10-02 MED ORDER — AMPHETAMINE-DEXTROAMPHET ER 30 MG PO CP24
60.0000 mg | ORAL_CAPSULE | ORAL | 0 refills | Status: DC
Start: 2022-11-05 — End: 2023-02-18

## 2022-10-02 MED ORDER — AMPHETAMINE-DEXTROAMPHET ER 30 MG PO CP24
60.0000 mg | ORAL_CAPSULE | ORAL | 0 refills | Status: DC
Start: 2022-10-06 — End: 2023-02-01

## 2022-10-02 NOTE — Progress Notes (Signed)
Subjective:    Patient ID: Ruth Petty, female    DOB: [birth year], 27 y.o.   MRN: [id number]   Chief Complaint: ADHD  HPI  Patient in for follow  up of adult ADHD. She has been on meds since she was in middle school. Has trouble concentrating when she doe snot have meds. 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 She is currently on adderall XR and is doing well.  Patient Active Problem List   Diagnosis Date Noted   Depression 08/10/2015   Generalized anxiety disorder 08/10/2015   ADD (attention deficit disorder) 04/24/2012       Review of Systems  Constitutional:  Negative for diaphoresis.  Eyes:  Negative for pain.  Respiratory:  Negative for shortness of breath.   Cardiovascular:  Negative for chest pain, palpitations and leg swelling.  Gastrointestinal:  Negative for abdominal pain.  Endocrine: Negative for polydipsia.  Skin:  Negative for rash.  Neurological:  Negative for dizziness, weakness and headaches.  Hematological:  Does not bruise/bleed easily.  All other systems reviewed and are negative.      Objective:   Physical Exam Constitutional:      Appearance: Normal appearance.  Cardiovascular:     Rate and Rhythm: Normal rate and regular rhythm.     Heart sounds: Normal heart sounds.  Pulmonary:     Effort: Pulmonary effort is normal.     Breath sounds: Normal breath sounds.  Skin:    General: Skin is warm.  Neurological:     General: No focal deficit present.     Mental Status: She is alert and oriented to person, place, and time.  Psychiatric:        Mood and Affect: Mood normal.        Behavior: Behavior normal.    BP 103/69   Pulse 75   Temp 97.9 F (36.6 C) (Temporal)   Resp 20   Ht 5\' 8"  (1.727 m)   Wt 157 lb (71.2 kg)   SpO2 97%   BMI 23.87 kg/m         Assessment & Plan:   Ruth Petty in today with chief complaint of No chief complaint on file.   1. Attention deficit hyperactivity disorder  (ADHD), combined type Stress management - amphetamine-dextroamphetamine (ADDERALL XR) 30 MG 24 hr capsule; Take 2 capsules (60 mg total) by mouth every morning. Take  1 capsule at 7am and one at 1pm daily  Dispense: 60 capsule; Refill: 0 - amphetamine-dextroamphetamine (ADDERALL XR) 30 MG 24 hr capsule; Take 2 capsules (60 mg total) by mouth every morning. Take  1 capsule at 7am and one at 1pm daily  Dispense: 60 capsule; Refill: 0 - amphetamine-dextroamphetamine (ADDERALL XR) 30 MG 24 hr capsule; Take 2 capsules (60 mg total) by mouth daily. Take 1 capsule by mouth at 7am and one at 1pm daily.  Dispense: 60 capsule; Refill: 0    The above assessment and management plan was discussed with the patient. The patient verbalized understanding of and has agreed to the management plan. Patient is aware to call the clinic if symptoms persist or worsen. Patient is aware when to return to the clinic for a follow-up visit. Patient educated on when it is appropriate to go to the emergency department.   Mary-Margaret Daphine Deutscher, FNP

## 2022-11-01 ENCOUNTER — Other Ambulatory Visit: Payer: Self-pay | Admitting: Nurse Practitioner

## 2022-11-01 DIAGNOSIS — F411 Generalized anxiety disorder: Secondary | ICD-10-CM

## 2022-12-28 ENCOUNTER — Encounter: Payer: Self-pay | Admitting: Family

## 2022-12-28 ENCOUNTER — Telehealth: Payer: Self-pay | Admitting: Family Medicine

## 2022-12-28 ENCOUNTER — Telehealth (INDEPENDENT_AMBULATORY_CARE_PROVIDER_SITE_OTHER): Payer: BC Managed Care – PPO | Admitting: Family

## 2022-12-28 DIAGNOSIS — J069 Acute upper respiratory infection, unspecified: Secondary | ICD-10-CM | POA: Diagnosis not present

## 2022-12-28 LAB — RAPID STREP SCREEN (MED CTR MEBANE ONLY): Strep Gp A Ag, IA W/Reflex: NEGATIVE

## 2022-12-28 LAB — CULTURE, GROUP A STREP

## 2022-12-28 MED ORDER — FLUTICASONE PROPIONATE 50 MCG/ACT NA SUSP: 2.0000 | Freq: Every day | NASAL | 6 refills | Status: DC

## 2022-12-28 MED ORDER — CETIRIZINE HCL 10 MG PO TABS
10.0000 mg | ORAL_TABLET | Freq: Every day | ORAL | 1 refills | Status: DC
Start: 2022-12-28 — End: 2023-06-12

## 2022-12-28 NOTE — Patient Instructions (Signed)

## 2022-12-28 NOTE — Telephone Encounter (Signed)
Copied from CRM [id number]. Topic: Clinical - Lab/Test Results >> Dec 28, 2022  3:44 PM Ruth Petty wrote: Reason for CRM: Patient calling for results of strep test called patient she is aware

## 2022-12-28 NOTE — Telephone Encounter (Signed)
Apt scheduled.  

## 2022-12-28 NOTE — Telephone Encounter (Unsigned)
Copied from CRM [id number]. Topic: General - Other >> Dec 28, 2022  2:08 PM Fonda Kinder J wrote: Reason for CRM: Pt's mother called in to schedule an appointment to be seen for a sore throat, but there are no available appointments until January. Pt's mother wanted to speak with someone in the clinic I advised her she would receive a call back

## 2022-12-28 NOTE — Progress Notes (Signed)
Virtual Visit Consent   Ruth Petty, you are scheduled for a virtual visit with a [facility] provider today. Just as with appointments in the office, your consent must be obtained to participate. Your consent will be active for this visit and any virtual visit you may have with one of our providers in the next 365 days. If you have a MyChart account, a copy of this consent can be sent to you electronically.  As this is a virtual visit, video technology does not allow for your provider to perform a traditional examination. This may limit your provider's ability to fully assess your condition. If your provider identifies any concerns that need to be evaluated in person or the need to arrange testing (such as labs, EKG, etc.), we will make arrangements to do so. Although advances in technology are sophisticated, we cannot ensure that it will always work on either your end or our end. If the connection with a video visit is poor, the visit may have to be switched to a telephone visit. With either a video or telephone visit, we are not always able to ensure that we have a secure connection.  By engaging in this virtual visit, you consent to the provision of healthcare and authorize for your insurance to be billed (if applicable) for the services provided during this visit. Depending on your insurance coverage, you may receive a charge related to this service.  I need to obtain your verbal consent now. Are you willing to proceed with your visit today? Ruth Petty has provided verbal consent on 12/28/2022 for a virtual visit (video or telephone). Jannifer Rodney, FNP  Date: 12/28/2022 3:06 PM  Virtual Visit via Video Note   I, Jannifer Rodney, connected with  Ruth Petty  ([id number], May 15, 1995) on 12/28/22 at  2:50 PM EST by a video-enabled telemedicine application and verified that I am speaking with the correct person using two identifiers.  Location: Patient: Virtual Visit Location  Patient: Other: work Provider: Pharmacist, community: Home Office   I discussed the limitations of evaluation and management by telemedicine and the availability of in person appointments. The patient expressed understanding and agreed to proceed.    History of Present Illness: Ruth Petty is a 27 y.o. who identifies as a female who was assigned female at birth, and is being seen today for sore throat and rhinorrhea that started yesterday.  HPI: URI  This is a new problem. The current episode started yesterday. The problem has been gradually worsening. There has been no fever. Associated symptoms include congestion, rhinorrhea, sneezing and a sore throat. Pertinent negatives include no coughing, ear pain, headaches, joint pain, sinus pain or swollen glands. The treatment provided mild relief.    Problems:  Patient Active Problem List   Diagnosis Date Noted   Depression 08/10/2015   Generalized anxiety disorder 08/10/2015   ADD (attention deficit disorder) 04/24/2012    Allergies: No Known Allergies Medications:  Current Outpatient Medications:    cetirizine (ZYRTEC ALLERGY) 10 MG tablet, Take 1 tablet (10 mg total) by mouth daily., Disp: 90 tablet, Rfl: 1   fluticasone (FLONASE) 50 MCG/ACT nasal spray, Place 2 sprays into both nostrils daily., Disp: 16 g, Rfl: 6   amphetamine-dextroamphetamine (ADDERALL XR) 30 MG 24 hr capsule, Take 2 capsules (60 mg total) by mouth every morning. Take  1 capsule at 7am and one at 1pm daily, Disp: 60 capsule, Rfl: 0   amphetamine-dextroamphetamine (ADDERALL XR) 30 MG 24 hr  capsule, Take 2 capsules (60 mg total) by mouth every morning. Take  1 capsule at 7am and one at 1pm daily, Disp: 60 capsule, Rfl: 0   amphetamine-dextroamphetamine (ADDERALL XR) 30 MG 24 hr capsule, Take 2 capsules (60 mg total) by mouth daily. Take 1 capsule by mouth at 7am and one at 1pm daily., Disp: 60 capsule, Rfl: 0   busPIRone (BUSPAR) 15 MG tablet, TAKE 1 TABLET  BY MOUTH 2 TIMES DAILY., Disp: 180 tablet, Rfl: 0   escitalopram (LEXAPRO) 20 MG tablet, Take 1 tablet (20 mg total) by mouth daily., Disp: 90 tablet, Rfl: 1   JUNEL 1.5/30 1.5-30 MG-MCG tablet, Take 1 tablet by mouth daily., Disp: , Rfl: 4   mupirocin ointment (BACTROBAN) 2 %, Apply to superficial cyst daily, Disp: 22 g, Rfl: 0   nebivolol (BYSTOLIC) 5 MG tablet, Take 1 tablet (5 mg total) by mouth daily., Disp: 90 tablet, Rfl: 3   omeprazole (PRILOSEC) 20 MG capsule, Take 1 capsule (20 mg total) by mouth daily., Disp: 30 capsule, Rfl: 3   Tazarotene (FABIOR) 0.1 % FOAM, Apply 1 application topically daily., Disp: 50 g, Rfl: 6   traZODone (DESYREL) 50 MG tablet, TAKE ONE TABLET AT BEDTIME, Disp: 90 tablet, Rfl: 0  Observations/Objective: Patient is well-developed, well-nourished in no acute distress.  Resting comfortably  at home.  Head is normocephalic, atraumatic.  No labored breathing.  Speech is clear and coherent with logical content.  Patient is alert and oriented at baseline.  Rhinorrhea   Assessment and Plan: 1. Viral URI (Primary) - cetirizine (ZYRTEC ALLERGY) 10 MG tablet; Take 1 tablet (10 mg total) by mouth daily.  Dispense: 90 tablet; Refill: 1 - fluticasone (FLONASE) 50 MCG/ACT nasal spray; Place 2 sprays into both nostrils daily.  Dispense: 16 g; Refill: 6 - Rapid Strep Screen ([facility] ONLY); Future  Pt will come to office and have strep test to rule out  - Take meds as prescribed - Use a cool mist humidifier  -Use saline nose sprays frequently -Force fluids -For any cough or congestion  Use plain Mucinex- regular strength or max strength is fine -For fever or aces or pains- take tylenol or ibuprofen. -Throat lozenges if help -Follow up if symptoms worsen or do not improve   Follow Up Instructions: I discussed the assessment and treatment plan with the patient. The patient was provided an opportunity to ask questions and all were answered. The patient agreed  with the plan and demonstrated an understanding of the instructions.  A copy of instructions were sent to the patient via MyChart unless otherwise noted below.     The patient was advised to call back or seek an in-person evaluation if the symptoms worsen or if the condition fails to improve as anticipated.    Jannifer Rodney, FNP

## 2023-01-02 ENCOUNTER — Emergency Department (HOSPITAL_BASED_OUTPATIENT_CLINIC_OR_DEPARTMENT_OTHER)
Admission: EM | Admit: 2023-01-02 | Discharge: 2023-01-02 | Disposition: A | Discharge status: Home / Self Care | Payer: BC Managed Care – PPO | Attending: Emergency Medicine | Admitting: Emergency Medicine

## 2023-01-02 ENCOUNTER — Encounter (HOSPITAL_BASED_OUTPATIENT_CLINIC_OR_DEPARTMENT_OTHER): Payer: Self-pay

## 2023-01-02 ENCOUNTER — Other Ambulatory Visit: Payer: Self-pay

## 2023-01-02 DIAGNOSIS — J069 Acute upper respiratory infection, unspecified: Secondary | ICD-10-CM | POA: Diagnosis not present

## 2023-01-02 DIAGNOSIS — H6691 Otitis media, unspecified, right ear: Secondary | ICD-10-CM | POA: Insufficient documentation

## 2023-01-02 DIAGNOSIS — H6690 Otitis media, unspecified, unspecified ear: Secondary | ICD-10-CM

## 2023-01-02 DIAGNOSIS — Z20822 Contact with and (suspected) exposure to covid-19: Secondary | ICD-10-CM | POA: Diagnosis not present

## 2023-01-02 DIAGNOSIS — B974 Respiratory syncytial virus as the cause of diseases classified elsewhere: Secondary | ICD-10-CM | POA: Diagnosis not present

## 2023-01-02 DIAGNOSIS — H9201 Otalgia, right ear: Secondary | ICD-10-CM | POA: Diagnosis present

## 2023-01-02 HISTORY — DX: Anxiety disorder, unspecified: F41.9

## 2023-01-02 LAB — RESP PANEL BY RT-PCR (RSV, FLU A&B, COVID)  RVPGX2
Influenza A by PCR: NEGATIVE
Influenza B by PCR: NEGATIVE
Resp Syncytial Virus by PCR: POSITIVE — AB
SARS Coronavirus 2 by RT PCR: NEGATIVE

## 2023-01-02 MED ORDER — AMOXICILLIN 250 MG PO CAPS: 250.0000 mg | ORAL_CAPSULE | Freq: Once | ORAL | Status: DC

## 2023-01-02 MED ORDER — AMOXICILLIN 875 MG PO TABS: 875.0000 mg | ORAL_TABLET | Freq: Two times a day (BID) | ORAL | 0 refills | Status: DC

## 2023-01-02 MED ORDER — AMOXICILLIN 500 MG PO CAPS
500.0000 mg | ORAL_CAPSULE | Freq: Once | ORAL | Status: AC
  Administered 2023-01-02: 500 mg via ORAL
  Filled 2023-01-02: qty 1

## 2023-01-02 MED ORDER — IBUPROFEN 800 MG PO TABS
800.0000 mg | ORAL_TABLET | Freq: Once | ORAL | Status: AC
  Administered 2023-01-02: 800 mg via ORAL
  Filled 2023-01-02: qty 1

## 2023-01-02 MED ORDER — AMOXICILLIN 875 MG PO TABS: 875.0000 mg | ORAL_TABLET | Freq: Two times a day (BID) | ORAL | 0 refills | Status: AC

## 2023-01-02 NOTE — ED Provider Notes (Signed)
[facility] EMERGENCY DEPARTMENT AT [facility] Provider Note   CSN: [id number] Arrival date & time: 01/02/23  1205     History  Chief Complaint  Patient presents with   Otalgia    Ruth Petty is a 27 y.o. female.  Patient is a 27 year old female with no significant past medical history presenting to the emergency department with right ear pain.  The patient states that since Friday she has been congested with a runny nose.  She states that she had a visit with her primary doctor and was tested for strep and was negative and was recommended to use Flonase and Zyrtec.  She states that last night she started to have some fullness in your ears and your ears were popping and then woke up with severe pain in her right ear.  She denies any drainage or any Q-tips or foreign body in her ear.  She denies any hearing changes.  She states that she did have a fever last night and took Tylenol at home.  The history is provided by the patient and a relative.  Otalgia      Home Medications Prior to Admission medications   Medication Sig Start Date End Date Taking? Authorizing Provider  amoxicillin (AMOXIL) 875 MG tablet Take 1 tablet (875 mg total) by mouth 2 (two) times daily for 5 days. 01/02/23 01/07/23  Elayne Snare K, DO  amphetamine-dextroamphetamine (ADDERALL XR) 30 MG 24 hr capsule Take 2 capsules (60 mg total) by mouth every morning. Take  1 capsule at 7am and one at 1pm daily 10/06/22 11/05/22  Bennie Pierini, FNP  amphetamine-dextroamphetamine (ADDERALL XR) 30 MG 24 hr capsule Take 2 capsules (60 mg total) by mouth every morning. Take  1 capsule at 7am and one at 1pm daily 11/05/22 12/05/22  Bennie Pierini, FNP  amphetamine-dextroamphetamine (ADDERALL XR) 30 MG 24 hr capsule Take 2 capsules (60 mg total) by mouth daily. Take 1 capsule by mouth at 7am and one at 1pm daily. 12/05/22 01/04/23  Daphine Deutscher, Mary-Margaret, FNP  busPIRone (BUSPAR) 15 MG tablet TAKE  1 TABLET BY MOUTH 2 TIMES DAILY. 11/01/22   Daphine Deutscher Mary-Margaret, FNP  cetirizine (ZYRTEC ALLERGY) 10 MG tablet Take 1 tablet (10 mg total) by mouth daily. 12/28/22   Jannifer Rodney A, FNP  escitalopram (LEXAPRO) 20 MG tablet Take 1 tablet (20 mg total) by mouth daily. 07/03/22   Daphine Deutscher, Mary-Margaret, FNP  fluticasone (FLONASE) 50 MCG/ACT nasal spray Place 2 sprays into both nostrils daily. 12/28/22   Jannifer Rodney A, FNP  JUNEL 1.5/30 1.5-30 MG-MCG tablet Take 1 tablet by mouth daily. 12/01/17   [provider]  mupirocin ointment (BACTROBAN) 2 % Apply to superficial cyst daily 01/25/21   Janalyn Harder, MD  nebivolol (BYSTOLIC) 5 MG tablet Take 1 tablet (5 mg total) by mouth daily. 08/15/22   Daphine Deutscher, Mary-Margaret, FNP  omeprazole (PRILOSEC) 20 MG capsule Take 1 capsule (20 mg total) by mouth daily. 05/09/21   Daryll Drown, NP  Tazarotene (FABIOR) 0.1 % FOAM Apply 1 application topically daily. 01/25/21   Janalyn Harder, MD  traZODone (DESYREL) 50 MG tablet TAKE ONE TABLET AT BEDTIME 12/21/21   Bennie Pierini, FNP      Allergies    Patient has no known allergies.    Review of Systems   Review of Systems  HENT:  Positive for ear pain.     Physical Exam Updated Vital Signs BP 112/69 (BP Location: Right Arm)   Pulse 92   Temp  98.5 F (36.9 C) (Oral)   Resp 16   Ht 5\' 8"  (1.727 m)   Wt 69.9 kg   LMP 12/24/2022   SpO2 100%   BMI 23.42 kg/m  Physical Exam Vitals and nursing note reviewed.  Constitutional:      General: She is not in acute distress.    Appearance: Normal appearance.  HENT:     Head: Normocephalic and atraumatic.     Right Ear: Ear canal and external ear normal. Tympanic membrane is erythematous and bulging.     Left Ear: Tympanic membrane, ear canal and external ear normal.     Nose: Congestion present.     Mouth/Throat:     Mouth: Mucous membranes are moist.     Pharynx: Oropharynx is clear.  Eyes:     Extraocular Movements: Extraocular  movements intact.     Conjunctiva/sclera: Conjunctivae normal.  Cardiovascular:     Rate and Rhythm: Normal rate and regular rhythm.     Heart sounds: Normal heart sounds.  Pulmonary:     Effort: Pulmonary effort is normal.     Breath sounds: Normal breath sounds.  Abdominal:     General: Abdomen is flat.  Musculoskeletal:        General: Normal range of motion.     Cervical back: Normal range of motion and neck supple.  Lymphadenopathy:     Cervical: No cervical adenopathy.  Skin:    General: Skin is warm and dry.  Neurological:     General: No focal deficit present.     Mental Status: She is alert and oriented to person, place, and time.  Psychiatric:        Mood and Affect: Mood normal.        Behavior: Behavior normal.     ED Results / Procedures / Treatments   Labs (all labs ordered are listed, but only abnormal results are displayed) Labs Reviewed  RESP PANEL BY RT-PCR (RSV, FLU A&B, COVID)  [id number]    EKG None  Radiology No results found.  Procedures Procedures    Medications Ordered in ED Medications  ibuprofen (ADVIL) tablet 800 mg (has no administration in time range)  amoxicillin (AMOXIL) capsule 500 mg (has no administration in time range)    ED Course/ Medical Decision Making/ A&P                                 Medical Decision Making This patient presents to the ED with chief complaint(s) of ear pain with no pertinent past medical history which further complicates the presenting complaint. The complaint involves an extensive differential diagnosis and also carries with it a high risk of complications and morbidity.    The differential diagnosis includes otitis media, otitis externa, radiation of dental pain, viral syndrome, congestion  Additional history obtained: Additional history obtained from family Records reviewed Primary Care Documents  ED Course and Reassessment: On patient's arrival she is hemodynamically stable, uncomfortable  appearing but in no acute distress.  Exam is consistent with otitis media with bulging erythematous TM, no evidence of otitis externa.  She was also swabbed for COVID, flu and RSV.  She has no significant risk factors that require antiviral treatment.  She will be given antibiotics for her ear infection and recommended primary care follow-up.  Independent labs interpretation:  The following labs were independently interpreted: Viral swab pending yeah  Independent visualization of imaging: - N/A  Consultation: - Consulted or discussed management/test interpretation w/ external professional: N/A  Consideration for admission or further workup: Patient has no emergent conditions requiring admission or further work-up at this time and is stable for discharge home with primary care follow-up  Social Determinants of health: N/A    Risk Prescription drug management.          Final Clinical Impression(s) / ED Diagnoses Final diagnoses:  Acute otitis media, unspecified otitis media type  Upper respiratory tract infection, unspecified type    Rx / DC Orders ED Discharge Orders          Ordered    amoxicillin (AMOXIL) 875 MG tablet  2 times daily,   Status:  Discontinued        01/02/23 1251    amoxicillin (AMOXIL) 875 MG tablet  2 times daily        01/02/23 1253              Rexford Maus, DO 01/02/23 1253

## 2023-01-02 NOTE — Discharge Instructions (Signed)
You were seen in the emergency department for your ear pain.  You do have an ear infection and I have given you an antibiotic and you should complete this as prescribed.  It is likely from your cold that caused your ear tubes to clog and resulted in an infection.  We did swab you for COVID, flu and RSV and you can follow-up these results online on your patient portal.  You should keep using the Flonase and can also use over-the-counter Mucinex to help with your congestion and can take Tylenol and Motrin every 6 hours as needed for pain.  You should return to the emergency department if you start to lose hearing, you continue to have fevers despite the antibiotics or if you have any other new or concerning symptoms.

## 2023-01-02 NOTE — ED Triage Notes (Signed)
In for eval of runny nose and right ear pain Report fever last night.  Started feeling sick on Friday with sore throat, was seen by UC - strep negative. diag URI, RX for flonase and zyrtec.

## 2023-01-02 NOTE — ED Notes (Signed)
Dc instructions reviewed with patient. Patient voiced understanding. Dc with belongings.  °

## 2023-01-28 ENCOUNTER — Ambulatory Visit: Payer: Self-pay | Admitting: Nurse Practitioner

## 2023-01-28 ENCOUNTER — Other Ambulatory Visit: Payer: Self-pay | Admitting: Nurse Practitioner

## 2023-01-28 ENCOUNTER — Telehealth: Payer: Self-pay | Admitting: Family Medicine

## 2023-01-28 DIAGNOSIS — R000 Tachycardia, unspecified: Secondary | ICD-10-CM

## 2023-01-28 NOTE — Telephone Encounter (Signed)
Patient had 3 refills for 90 day supply available at [facility] as of 08/15/22. Called patient to inform of refills available. Patient states she also was not able to refill her adderall due to [facility] said [facility] did not [facility] avaialble at the location she requested, instructed [facility] to [facility].

## 2023-01-28 NOTE — Telephone Encounter (Unsigned)
Copied from CRM [id number]. Topic: Clinical - Prescription Issue >> Jan 28, 2023  3:16 PM Gaetano Hawthorne wrote: Reason for CRM: Patient has been experiencing issues [facility] [place] [facility]ing the brand name of Adderral 30 mg (they haven't been able to get it in stock for the past 2 weeks). Patient needs some assistance with a new prescription being called into a pharmacy - they will go to any pharmacy in the area provided they have the brand name - [facility] some of [facility] pharmacies in [place] but if your office has another suggestion that would be appreciated.

## 2023-01-28 NOTE — Telephone Encounter (Signed)
Please have patient find out weather she can get brand name adderall and we will send prescription there.

## 2023-01-28 NOTE — Telephone Encounter (Signed)
Called patient regarding her bystolic medication has [facility] and to follow up with [facility] regarding her [facility]der[facility] prescription.

## 2023-01-29 NOTE — Telephone Encounter (Signed)
Aware to find which pharmacy in [place] has the brand name Adderall in stock then she will call us back

## 2023-02-01 ENCOUNTER — Telehealth: Payer: Self-pay | Admitting: Nurse Practitioner

## 2023-02-01 DIAGNOSIS — F902 Attention-deficit hyperactivity disorder, combined type: Secondary | ICD-10-CM

## 2023-02-01 MED ORDER — ADDERALL XR 30 MG PO CP24: 60.0000 mg | ORAL_CAPSULE | ORAL | 0 refills | Status: DC

## 2023-02-01 NOTE — Telephone Encounter (Signed)
Patients mother notified and verbalized understanding

## 2023-02-06 ENCOUNTER — Other Ambulatory Visit: Payer: Self-pay | Admitting: Nurse Practitioner

## 2023-02-06 DIAGNOSIS — F411 Generalized anxiety disorder: Secondary | ICD-10-CM

## 2023-02-07 ENCOUNTER — Other Ambulatory Visit: Payer: Self-pay | Admitting: Nurse Practitioner

## 2023-02-07 DIAGNOSIS — F331 Major depressive disorder, recurrent, moderate: Secondary | ICD-10-CM

## 2023-02-18 ENCOUNTER — Other Ambulatory Visit: Payer: Self-pay | Admitting: Nurse Practitioner

## 2023-02-18 DIAGNOSIS — F902 Attention-deficit hyperactivity disorder, combined type: Secondary | ICD-10-CM

## 2023-02-18 MED ORDER — AMPHETAMINE-DEXTROAMPHET ER 30 MG PO CP24: 60.0000 mg | ORAL_CAPSULE | ORAL | 0 refills | Status: DC

## 2023-03-01 ENCOUNTER — Other Ambulatory Visit: Payer: Self-pay | Admitting: Nurse Practitioner

## 2023-03-01 DIAGNOSIS — F411 Generalized anxiety disorder: Secondary | ICD-10-CM

## 2023-03-01 DIAGNOSIS — F331 Major depressive disorder, recurrent, moderate: Secondary | ICD-10-CM

## 2023-03-01 NOTE — Telephone Encounter (Signed)
 MMM pt NTBS 30-d given 02/07/23

## 2023-03-01 NOTE — Telephone Encounter (Signed)
 Appt scheduled for 03/14/2023

## 2023-03-05 ENCOUNTER — Other Ambulatory Visit: Payer: Self-pay | Admitting: Nurse Practitioner

## 2023-03-05 DIAGNOSIS — F331 Major depressive disorder, recurrent, moderate: Secondary | ICD-10-CM

## 2023-03-05 DIAGNOSIS — F411 Generalized anxiety disorder: Secondary | ICD-10-CM

## 2023-03-14 ENCOUNTER — Ambulatory Visit: Payer: Self-pay | Admitting: Nurse Practitioner

## 2023-03-14 ENCOUNTER — Encounter: Payer: Self-pay | Admitting: Nurse Practitioner

## 2023-03-14 DIAGNOSIS — L700 Acne vulgaris: Secondary | ICD-10-CM

## 2023-03-14 DIAGNOSIS — F902 Attention-deficit hyperactivity disorder, combined type: Secondary | ICD-10-CM

## 2023-03-14 MED ORDER — AMPHETAMINE-DEXTROAMPHET ER 30 MG PO CP24: 60.0000 mg | ORAL_CAPSULE | Freq: Every day | ORAL | 0 refills | Status: DC

## 2023-03-14 MED ORDER — AMPHETAMINE-DEXTROAMPHET ER 30 MG PO CP24: 60.0000 mg | ORAL_CAPSULE | ORAL | 0 refills | Status: DC

## 2023-03-14 NOTE — Patient Instructions (Signed)

## 2023-03-14 NOTE — Progress Notes (Signed)
   Subjective:    Patient ID: Ruth Petty, female    DOB: May 23, 1995, 28 y.o.   MRN: [id number]   Chief Complaint: ADHD  HPI  Patient has had ADHD since middle school. 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 She is currently on adderall XR 30mg  daily. She is doing well on current medication and dose. No side effects. Patient Active Problem List   Diagnosis Date Noted   Depression 08/10/2015   Generalized anxiety disorder 08/10/2015   ADD (attention deficit disorder) 04/24/2012       Review of Systems  Constitutional:  Negative for diaphoresis.  Eyes:  Negative for pain.  Respiratory:  Negative for shortness of breath.   Cardiovascular:  Negative for chest pain, palpitations and leg swelling.  Gastrointestinal:  Negative for abdominal pain.  Endocrine: Negative for polydipsia.  Skin:  Negative for rash.  Neurological:  Negative for dizziness, weakness and headaches.  Hematological:  Does not bruise/bleed easily.  All other systems reviewed and are negative.      Objective:   Physical Exam Constitutional:      Appearance: Normal appearance.  Cardiovascular:     Rate and Rhythm: Normal rate and regular rhythm.     Heart sounds: Normal heart sounds.  Pulmonary:     Breath sounds: Normal breath sounds.  Neurological:     General: No focal deficit present.     Mental Status: She is alert and oriented to person, place, and time.  Psychiatric:        Mood and Affect: Mood normal.        Behavior: Behavior normal.    BP 95/64   Pulse (!) 101   Temp (!) 97.2 F (36.2 C) (Temporal)   Ht 5\' 8"  (1.727 m)   Wt 157 lb (71.2 kg)   SpO2 97%   BMI 23.87 kg/m         Assessment & Plan:  Jackqulyn Livings in today with chief complaint of No chief complaint on file.   1. Attention deficit hyperactivity disorder (ADHD), combined type Stress management - amphetamine-dextroamphetamine (ADDERALL XR) 30 MG 24 hr capsule; Take 2 capsules  (60 mg total) by mouth daily. Take 1 capsule by mouth at 7am and one at 1pm daily.  Dispense: 60 capsule; Refill: 0 - amphetamine-dextroamphetamine (ADDERALL XR) 30 MG 24 hr capsule; Take 2 capsules (60 mg total) by mouth every morning. Take  1 capsule at 7am and one at 1pm daily  Dispense: 60 capsule; Refill: 0 - amphetamine-dextroamphetamine (ADDERALL XR) 30 MG 24 hr capsule; Take 2 capsules (60 mg total) by mouth daily. Take 1 capsule by mouth at 7am and one at 1pm daily.  Dispense: 60 capsule; Refill: 0    The above assessment and management plan was discussed with the patient. The patient verbalized understanding of and has agreed to the management plan. Patient is aware to call the clinic if symptoms persist or worsen. Patient is aware when to return to the clinic for a follow-up visit. Patient educated on when it is appropriate to go to the emergency department.   Mary-Margaret Daphine Deutscher, FNP

## 2023-03-27 ENCOUNTER — Other Ambulatory Visit: Payer: Self-pay | Admitting: Nurse Practitioner

## 2023-03-27 DIAGNOSIS — F411 Generalized anxiety disorder: Secondary | ICD-10-CM

## 2023-03-27 DIAGNOSIS — F331 Major depressive disorder, recurrent, moderate: Secondary | ICD-10-CM

## 2023-06-11 ENCOUNTER — Encounter: Payer: Self-pay | Admitting: Nurse Practitioner

## 2023-06-11 ENCOUNTER — Ambulatory Visit (INDEPENDENT_AMBULATORY_CARE_PROVIDER_SITE_OTHER): Admitting: Nurse Practitioner

## 2023-06-11 DIAGNOSIS — F902 Attention-deficit hyperactivity disorder, combined type: Secondary | ICD-10-CM

## 2023-06-11 DIAGNOSIS — F331 Major depressive disorder, recurrent, moderate: Secondary | ICD-10-CM | POA: Diagnosis not present

## 2023-06-11 DIAGNOSIS — F411 Generalized anxiety disorder: Secondary | ICD-10-CM | POA: Diagnosis not present

## 2023-06-11 DIAGNOSIS — F5104 Psychophysiologic insomnia: Secondary | ICD-10-CM

## 2023-06-11 MED ORDER — AMPHETAMINE-DEXTROAMPHET ER 30 MG PO CP24
60.0000 mg | ORAL_CAPSULE | Freq: Every day | ORAL | 0 refills | Status: DC
Start: 2023-07-16 — End: 2023-09-12

## 2023-06-11 MED ORDER — ESCITALOPRAM OXALATE 20 MG PO TABS: 20.0000 mg | ORAL_TABLET | Freq: Every day | ORAL | 1 refills | Status: DC

## 2023-06-11 MED ORDER — AMPHETAMINE-DEXTROAMPHET ER 30 MG PO CP24
60.0000 mg | ORAL_CAPSULE | ORAL | 0 refills | Status: DC
Start: 2023-06-16 — End: 2023-09-12

## 2023-06-11 MED ORDER — AMPHETAMINE-DEXTROAMPHET ER 30 MG PO CP24
60.0000 mg | ORAL_CAPSULE | Freq: Every day | ORAL | 0 refills | Status: DC
Start: 2023-08-15 — End: 2023-09-12

## 2023-06-11 MED ORDER — TRAZODONE HCL 50 MG PO TABS: ORAL_TABLET | ORAL | 1 refills | Status: DC

## 2023-06-11 MED ORDER — BUSPIRONE HCL 15 MG PO TABS: 15.0000 mg | ORAL_TABLET | Freq: Two times a day (BID) | ORAL | 1 refills | Status: DC

## 2023-06-11 NOTE — Patient Instructions (Signed)
 Insomnia Insomnia is a sleep disorder that makes it difficult to fall asleep or stay asleep. Insomnia can cause fatigue, low energy, difficulty concentrating, mood swings, and poor performance at work or school. There are three different ways to classify insomnia: Difficulty falling asleep. Difficulty staying asleep. Waking up too early in the morning. Any type of insomnia can be long-term (chronic) or short-term (acute). Both are common. Short-term insomnia usually lasts for 3 months or less. Chronic insomnia occurs at least three times a week for longer than 3 months. What are the causes? Insomnia may be caused by another condition, situation, or substance, such as: Having certain mental health conditions, such as anxiety and depression. Using caffeine, alcohol, tobacco, or drugs. Having gastrointestinal conditions, such as gastroesophageal reflux disease (GERD). Having certain medical conditions. These include: Asthma. Alzheimer's disease. Stroke. Chronic pain. An overactive thyroid gland (hyperthyroidism). Other sleep disorders, such as restless legs syndrome and sleep apnea. Menopause. Sometimes, the cause of insomnia may not be known. What increases the risk? Risk factors for insomnia include: Gender. Females are affected more often than males. Age. Insomnia is more common as people get older. Stress and certain medical and mental health conditions. Lack of exercise. Having an irregular work schedule. This may include working night shifts and traveling between different time zones. What are the signs or symptoms? If you have insomnia, the main symptom is having trouble falling asleep or having trouble staying asleep. This may lead to other symptoms, such as: Feeling tired or having low energy. Feeling nervous about going to sleep. Not feeling rested in the morning. Having trouble concentrating. Feeling irritable, anxious, or depressed. How is this diagnosed? This condition  may be diagnosed based on: Your symptoms and medical history. Your health care provider may ask about: Your sleep habits. Any medical conditions you have. Your mental health. A physical exam. How is this treated? Treatment for insomnia depends on the cause. Treatment may focus on treating an underlying condition that is causing the insomnia. Treatment may also include: Medicines to help you sleep. Counseling or therapy. Lifestyle adjustments to help you sleep better. Follow these instructions at home: Eating and drinking  Limit or avoid alcohol, caffeinated beverages, and products that contain nicotine and tobacco, especially close to bedtime. These can disrupt your sleep. Do not eat a large meal or eat spicy foods right before bedtime. This can lead to digestive discomfort that can make it hard for you to sleep. Sleep habits  Keep a sleep diary to help you and your health care provider figure out what could be causing your insomnia. Write down: When you sleep. When you wake up during the night. How well you sleep and how rested you feel the next day. Any side effects of medicines you are taking. What you eat and drink. Make your bedroom a dark, comfortable place where it is easy to fall asleep. Put up shades or blackout curtains to block light from outside. Use a white noise machine to block noise. Keep the temperature cool. Limit screen use before bedtime. This includes: Not watching TV. Not using your smartphone, tablet, or computer. Stick to a routine that includes going to bed and waking up at the same times every day and night. This can help you fall asleep faster. Consider making a quiet activity, such as reading, part of your nighttime routine. Try to avoid taking naps during the day so that you sleep better at night. Get out of bed if you are still awake after  15 minutes of trying to sleep. Keep the lights down, but try reading or doing a quiet activity. When you feel  sleepy, go back to bed. General instructions Take over-the-counter and prescription medicines only as told by your health care provider. Exercise regularly as told by your health care provider. However, avoid exercising in the hours right before bedtime. Use relaxation techniques to manage stress. Ask your health care provider to suggest some techniques that may work well for you. These may include: Breathing exercises. Routines to release muscle tension. Visualizing peaceful scenes. Make sure that you drive carefully. Do not drive if you feel very sleepy. Keep all follow-up visits. This is important. Contact a health care provider if: You are tired throughout the day. You have trouble in your daily routine due to sleepiness. You continue to have sleep problems, or your sleep problems get worse. Get help right away if: You have thoughts about hurting yourself or someone else. Get help right away if you feel like you may hurt yourself or others, or have thoughts about taking your own life. Go to your nearest emergency room or: Call 911. Call the National Suicide Prevention Lifeline at (906)021-1611 or 988. This is open 24 hours a day. Text the Crisis Text Line at 325-069-2793. Summary Insomnia is a sleep disorder that makes it difficult to fall asleep or stay asleep. Insomnia can be long-term (chronic) or short-term (acute). Treatment for insomnia depends on the cause. Treatment may focus on treating an underlying condition that is causing the insomnia. Keep a sleep diary to help you and your health care provider figure out what could be causing your insomnia. This information is not intended to replace advice given to you by your health care provider. Make sure you discuss any questions you have with your health care provider. Document Revised: 12/05/2020 Document Reviewed: 12/05/2020 Elsevier Patient Education  2024 [employer].

## 2023-06-11 NOTE — Progress Notes (Signed)
 Subjective:    Patient ID: Ruth Petty, female    DOB: Sep 11, 1995, 28 y.o.   MRN: [id number]   Chief Complaint: medical management of chronic issues    Ruth Petty  1. Attention deficit hyperactivity disorder (ADHD), combined type Patient has been on adhd meds since high school. 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 She is on adderall  30mg  daily and is doing well.  2. Moderate episode of recurrent major depressive disorder (HCC) Is on lexapro  and is doing well    06/11/2023    3:33 PM 03/14/2023    3:35 PM 10/02/2022    3:53 PM  Depression screen PHQ 2/9  Decreased Interest 0 1 1  Down, Depressed, Hopeless 1 1 1   PHQ - 2 Score 1 2 2   Altered sleeping  0 0  Tired, decreased energy  1 1  Change in appetite  2 1  Feeling bad or failure about yourself   1 0  Trouble concentrating  0 0  Moving slowly or fidgety/restless  0 0  Suicidal thoughts  0 0  PHQ-9 Score  6 4  Difficult doing work/chores  Somewhat difficult Somewhat difficult        3. Generalized anxiety disorder Take buspar  as needed.    03/14/2023    3:35 PM 10/02/2022    3:54 PM 08/15/2022    9:55 AM 08/10/2022   10:11 AM  GAD 7 : Generalized Anxiety Score  Nervous, Anxious, on Edge 1 1 1 1   Control/stop worrying 1 1 1 1   Worry too much - different things 1 1 1 2   Trouble relaxing 1 1 1 2   Restless 0 0 0 2  Easily annoyed or irritable 1 1 1 1   Afraid - awful might happen 1 1 1  0  Total GAD 7 Score 6 6 6 9   Anxiety Difficulty Somewhat difficult Somewhat difficult Somewhat difficult        4. 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 Has been sleeping well. Does not take meds every night    Patient Active Problem List   Diagnosis Date Noted   Depression 08/10/2015   Generalized anxiety disorder 08/10/2015   ADD (attention deficit disorder) 04/24/2012    Wt Readings from Last 3 Encounters:  06/11/23 157 lb (71.2 kg)  03/14/23 157 lb (71.2 kg)   01/02/23 154 lb (69.9 kg)   BMI Readings from Last 3 Encounters:  06/11/23 23.87 kg/m  03/14/23 23.87 kg/m  01/02/23 23.42 kg/m      Review of Systems  Constitutional:  Negative for diaphoresis.  Eyes:  Negative for pain.  Respiratory:  Negative for shortness of breath.   Cardiovascular:  Negative for chest pain, palpitations and leg swelling.  Gastrointestinal:  Negative for abdominal pain.  Endocrine: Negative for polydipsia.  Skin:  Negative for rash.  Neurological:  Negative for dizziness, weakness and headaches.  Hematological:  Does not bruise/bleed easily.  All other systems reviewed and are negative.      Objective:   Physical Exam Vitals and nursing note reviewed.  Constitutional:      General: She is not in acute distress.    Appearance: Normal appearance. She is well-developed.  Neck:     Vascular: No carotid bruit or JVD.  Cardiovascular:     Rate and Rhythm: Normal rate and regular rhythm.     Heart sounds: Normal heart sounds.  Pulmonary:     Effort: Pulmonary effort is normal.  No respiratory distress.     Breath sounds: Normal breath sounds. No wheezing or rales.  Chest:     Chest wall: No tenderness.  Abdominal:     General: Bowel sounds are normal. There is no distension or abdominal bruit.     Palpations: Abdomen is soft. There is no hepatomegaly, splenomegaly, mass or pulsatile mass.     Tenderness: There is no abdominal tenderness.  Musculoskeletal:        General: Normal range of motion.     Cervical back: Normal range of motion and neck supple.  Lymphadenopathy:     Cervical: No cervical adenopathy.  Skin:    General: Skin is warm and dry.  Neurological:     Mental Status: She is alert and oriented to person, place, and time.     Deep Tendon Reflexes: Reflexes are normal and symmetric.  Psychiatric:        Behavior: Behavior normal.        Thought Content: Thought content normal.        Judgment: Judgment normal.    BP 94/63    Pulse 79   Temp 97.9 F (36.6 C) (Temporal)   Ht 5\' 8"  (1.727 m)   Wt 157 lb (71.2 kg)   SpO2 98%   BMI 23.87 kg/m          Assessment & Plan:  Ruth Petty in today with chief complaint of medical management of chronic issues    1. Attention deficit hyperactivity disorder (ADHD), combined type - amphetamine -dextroamphetamine (ADDERALL  XR) 30 MG 24 hr capsule; Take 2 capsules (60 mg total) by mouth daily. Take 1 capsule by mouth at 7am and one at 1pm daily.  Dispense: 60 capsule; Refill: 0 - amphetamine -dextroamphetamine (ADDERALL  XR) 30 MG 24 hr capsule; Take 2 capsules (60 mg total) by mouth every morning. Take  1 capsule at 7am and one at 1pm daily  Dispense: 60 capsule; Refill: 0 - amphetamine -dextroamphetamine (ADDERALL  XR) 30 MG 24 hr capsule; Take 2 capsules (60 mg total) by mouth every morning. Take  1 capsule at 7am and one at 1pm daily  Dispense: 60 capsule; Refill: 0  2. Moderate episode of recurrent major depressive disorder (HCC) - escitalopram  (LEXAPRO ) 20 MG tablet; Take 1 tablet (20 mg total) by mouth daily.  Dispense: 90 tablet; Refill: 1  3. Generalized anxiety disorder - busPIRone  (BUSPAR ) 15 MG tablet; Take 1 tablet (15 mg total) by mouth 2 (two) times daily. (NEEDS TO BE SEEN BEFORE NEXT REFILL)  Dispense: 60 tablet; Refill: 0  Stress management  The above assessment and management plan was discussed with the patient. The patient verbalized understanding of and has agreed to the management plan. Patient is aware to call the clinic if symptoms persist or worsen. Patient is aware when to return to the clinic for a follow-up visit. Patient educated on when it is appropriate to go to the emergency department.   Mary-Margaret Gaylyn Keas, FNP

## 2023-06-12 ENCOUNTER — Other Ambulatory Visit: Payer: Self-pay | Admitting: Family

## 2023-06-12 DIAGNOSIS — J069 Acute upper respiratory infection, unspecified: Secondary | ICD-10-CM

## 2023-06-22 ENCOUNTER — Other Ambulatory Visit: Payer: Self-pay | Admitting: Family

## 2023-06-22 DIAGNOSIS — J069 Acute upper respiratory infection, unspecified: Secondary | ICD-10-CM

## 2023-07-17 ENCOUNTER — Other Ambulatory Visit: Payer: Self-pay | Admitting: Nurse Practitioner

## 2023-07-17 DIAGNOSIS — R000 Tachycardia, unspecified: Secondary | ICD-10-CM

## 2023-09-12 ENCOUNTER — Ambulatory Visit: Admitting: Nurse Practitioner

## 2023-09-12 ENCOUNTER — Encounter: Payer: Self-pay | Admitting: Nurse Practitioner

## 2023-09-12 VITALS — BP 106/48 | HR 64 | Temp 98.1°F | Ht 68.0 in | Wt 159.0 lb

## 2023-09-12 DIAGNOSIS — Z23 Encounter for immunization: Secondary | ICD-10-CM | POA: Diagnosis not present

## 2023-09-12 DIAGNOSIS — F902 Attention-deficit hyperactivity disorder, combined type: Secondary | ICD-10-CM

## 2023-09-12 DIAGNOSIS — R000 Tachycardia, unspecified: Secondary | ICD-10-CM | POA: Diagnosis not present

## 2023-09-12 MED ORDER — AMPHETAMINE-DEXTROAMPHET ER 30 MG PO CP24: 60.0000 mg | ORAL_CAPSULE | ORAL | 0 refills | Status: DC

## 2023-09-12 MED ORDER — NEBIVOLOL HCL 5 MG PO TABS
5.0000 mg | ORAL_TABLET | Freq: Every day | ORAL | 0 refills | Status: DC
Start: 2023-09-12 — End: 2023-10-18

## 2023-09-12 MED ORDER — AMPHETAMINE-DEXTROAMPHET ER 30 MG PO CP24: 60.0000 mg | ORAL_CAPSULE | Freq: Every day | ORAL | 0 refills | Status: DC

## 2023-09-12 NOTE — Progress Notes (Signed)
   Subjective:    Patient ID: Ruth Petty, female    DOB: 27-Jun-1995, 28 y.o.   MRN: [id number]   Chief Complaint: ADHD  HPI  Patient in for follow  up of adult ADHD. She has been on meds since she was in middle school. Has trouble concentrating when she does not have meds. 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 She is currently on adderall  XR and is doing well.  Patient Active Problem List   Diagnosis Date Noted   Depression 08/10/2015   Generalized anxiety disorder 08/10/2015   ADD (attention deficit disorder) 04/24/2012     Resting heart rate is in the 90's. Is on bystolic  for rate control. Is dong well.  Review of Systems  Constitutional:  Negative for diaphoresis.  Eyes:  Negative for pain.  Respiratory:  Negative for shortness of breath.   Cardiovascular:  Negative for chest pain, palpitations and leg swelling.  Gastrointestinal:  Negative for abdominal pain.  Endocrine: Negative for polydipsia.  Skin:  Negative for rash.  Neurological:  Negative for dizziness, weakness and headaches.  Hematological:  Does not bruise/bleed easily.  All other systems reviewed and are negative.      Objective:   Physical Exam Constitutional:      Appearance: Normal appearance.  Cardiovascular:     Rate and Rhythm: Normal rate and regular rhythm.     Heart sounds: Normal heart sounds.  Pulmonary:     Effort: Pulmonary effort is normal.     Breath sounds: Normal breath sounds.  Skin:    General: Skin is warm.  Neurological:     General: No focal deficit present.     Mental Status: She is alert and oriented to person, place, and time.  Psychiatric:        Mood and Affect: Mood normal.        Behavior: Behavior normal.    BP (!) 106/48   Pulse 64   Temp 98.1 F (36.7 C) (Temporal)   Ht 5' 8 (1.727 m)   Wt 159 lb (72.1 kg)   SpO2 98%   BMI 24.18 kg/m          Assessment & Plan:   Ruth Petty in today with chief complaint of No  chief complaint on file.   1. Attention deficit hyperactivity disorder (ADHD), combined type Stress management - amphetamine -dextroamphetamine (ADDERALL  XR) 30 MG 24 hr capsule; Take 2 capsules (60 mg total) by mouth every morning. Take  1 capsule at 7am and one at 1pm daily  Dispense: 60 capsule; Refill: 0 - amphetamine -dextroamphetamine (ADDERALL  XR) 30 MG 24 hr capsule; Take 2 capsules (60 mg total) by mouth every morning. Take  1 capsule at 7am and one at 1pm daily  Dispense: 60 capsule; Refill: 0 - amphetamine -dextroamphetamine (ADDERALL  XR) 30 MG 24 hr capsule; Take 2 capsules (60 mg total) by mouth daily. Take 1 capsule by mouth at 7am and one at 1pm daily.  Dispense: 60 capsule; Refill: 0  2. Sinus tachycardia Continue bystolic  as prescribed Avoid caffeine  The above assessment and management plan was discussed with the patient. The patient verbalized understanding of and has agreed to the management plan. Patient is aware to call the clinic if symptoms persist or worsen. Patient is aware when to return to the clinic for a follow-up visit. Patient educated on when it is appropriate to go to the emergency department.   Ruth Gladis, FNP

## 2023-09-12 NOTE — Patient Instructions (Signed)
 Sinus Tachycardia  Sinus tachycardia is a fast heartbeat. In sinus tachycardia, the heart beats more than 100 times a minute. Sinus tachycardia starts in the part of the heart called the sinoatrial (SA) node. Sinus tachycardia may be harmless, or it may be a sign of a serious condition. What are the causes? This condition may be caused by: Exercise or exertion. A fever. Pain. Loss of body fluids (dehydration). Severe bleeding (hemorrhage). Anxiety and stress. Certain substances, including: Alcohol. Caffeine. Tobacco and nicotine products. Cold medicines. Illegal drugs. Medical conditions including: Heart disease. An infection. An overactive thyroid  (hyperthyroidism). A lack of red blood cells (anemia). What are the signs or symptoms? Symptoms of this condition include: A feeling that the heart is beating fast or unevenly (palpitations). Suddenly noticing your heartbeat (cardiac awareness). Lightheadedness. Tiredness (fatigue). Shortness of breath. Chest pain. Nausea. Fainting. How is this diagnosed? This condition is diagnosed with: A physical exam. Tests or monitoring, such as: Blood tests. An electrocardiogram (ECG). This test measures the electrical activity of the heart. Ambulatory cardiac monitor. This records your heartbeats for 24 hours or more. You may be referred to a heart specialist (cardiologist). How is this treated? Treatment for this condition depends on the cause. Treatment may involve: Treating the underlying condition. Taking new medicines or changing your current medicines as told by your health care provider. Making changes to your diet or lifestyle. Follow these instructions at home: Lifestyle  Do not use any products that contain nicotine or tobacco. These products include cigarettes, chewing tobacco, and vaping devices, such as e-cigarettes. If you need help quitting, ask your health care provider. Do not use illegal drugs, such as  cocaine. Learn relaxation methods to help you when you get stressed or anxious. These include deep breathing. Avoid caffeine or other stimulants, including herbal stimulants that are found in energy drinks. Alcohol use  Do not drink alcohol if: Your health care provider tells you not to drink. You are pregnant, may be pregnant, or are planning to become pregnant. If you drink alcohol: Limit how much you have to: 0-1 drink a day for women. 0-2 drinks a day for men. Know how much alcohol is in your drink. In the [place] one drink equals one 12 oz bottle of beer (355 mL), one 5 oz glass of wine (148 mL), or one 1 oz glass of hard liquor (44 mL). General instructions Drink enough fluids to keep your urine pale yellow. Take over-the-counter and prescription medicines only as told by your health care provider. Ask your health care provider about taking vitamins, herbs, and supplements. Contact a health care provider if: You have vomiting or diarrhea that does not go away. You have a fever. You have weakness or dizziness. You feel faint. Get help right away if: You have pain in your chest, upper arms, jaw, or neck. You have palpitations that do not go away. Summary In sinus tachycardia, the heart beats more than 100 times a minute. Sinus tachycardia may be harmless, or it may be a sign of a serious condition. Treatment for this condition depends on the cause or the underlying condition. Get help right away if you have pain in your chest, upper arms, jaw, or neck. This information is not intended to replace advice given to you by your health care provider. Make sure you discuss any questions you have with your health care provider. Document Revised: 04/25/2021 Document Reviewed: 04/25/2021 Elsevier Patient Education  2024 [employer].

## 2023-10-18 ENCOUNTER — Other Ambulatory Visit: Payer: Self-pay | Admitting: Nurse Practitioner

## 2023-10-18 DIAGNOSIS — R000 Tachycardia, unspecified: Secondary | ICD-10-CM

## 2023-10-29 ENCOUNTER — Ambulatory Visit: Admitting: Family Medicine

## 2023-10-29 ENCOUNTER — Encounter: Payer: Self-pay | Admitting: Family Medicine

## 2023-10-29 VITALS — BP 111/70 | HR 92 | Temp 97.7°F | Ht 68.0 in | Wt 153.2 lb

## 2023-10-29 DIAGNOSIS — J069 Acute upper respiratory infection, unspecified: Secondary | ICD-10-CM

## 2023-10-29 MED ORDER — FLUTICASONE PROPIONATE 50 MCG/ACT NA SUSP: 2.0000 | Freq: Every day | NASAL | 6 refills | Status: AC

## 2023-10-29 NOTE — Progress Notes (Signed)
 Subjective:  Patient ID: Ruth Petty, female    DOB: 1995-08-30, 28 y.o.   MRN: [id number]  Patient Care Team: Gladis Mustard, FNP as PCP - General (Family Medicine) Ginette Shasta NOVAK, NP as Nurse Practitioner (Nurse Practitioner)   Chief Complaint:  Nasal Congestion, Ear Pain, and Chills (X 5 days- otc mucinex )   HPI: GENNELL Petty is a 28 y.o. female presenting on 10/29/2023 for Nasal Congestion, Ear Pain, and Chills (X 5 days- otc mucinex )   Ruth Petty is a 28 year old female who presents with nasal congestion and dizziness after taking Mucinex DM.  Nasal congestion and upper respiratory symptoms - Nasal congestion began approximately one week ago - Head feels stuffy - Ear popping occurs when blowing her nose - Intermittent use of Flonase  for about four days - No wheezing, shortness of breath, or cough - No fever; temperature measured at 96-26F - No other medications used besides Mucinex DM and Flonase   Adverse effects from mucinex dm - Dizziness occurred after taking Mucinex DM - Believes she took an excessive amount of Mucinex DM - Discontinued Mucinex DM due to dizziness  Constitutional symptoms - Sensation of feeling 'hot and cold' - No true fever; temperature 96-26F          Relevant past medical, surgical, family, and social history reviewed and updated as indicated.  Allergies and medications reviewed and updated. Data reviewed: Chart in Epic.   Past Medical History:  Diagnosis Date   ADHD (attention deficit hyperactivity disorder)    Anxiety     Past Surgical History:  Procedure Laterality Date   WISDOM TOOTH EXTRACTION      Social History   Socioeconomic History   Marital status: Single    Spouse name: Not on file   Number of children: Not on file   Years of education: Not on file   Highest education level: Not on file  Occupational History   Not on file  Tobacco Use   Smoking status: Never   Smokeless  tobacco: Never  Vaping Use   Vaping status: Never Used  Substance and Sexual Activity   Alcohol use: No   Drug use: No   Sexual activity: Not on file  Other Topics Concern   Not on file  Social History Narrative   Not on file   Social Drivers of Health   Financial Resource Strain: Not on file  Food Insecurity: Not on file  Transportation Needs: Not on file  Physical Activity: Not on file  Stress: Not on file  Social Connections: Not on file  Intimate Partner Violence: Not on file    Outpatient Encounter Medications as of 10/29/2023  Medication Sig   amphetamine -dextroamphetamine (ADDERALL  XR) 30 MG 24 hr capsule Take 2 capsules (60 mg total) by mouth daily. Take 1 capsule by mouth at 7am and one at 1pm daily.   amphetamine -dextroamphetamine (ADDERALL  XR) 30 MG 24 hr capsule Take 2 capsules (60 mg total) by mouth daily. Take 1 capsule by mouth at 7am and one at 1pm daily.   [START ON 11/12/2023] amphetamine -dextroamphetamine (ADDERALL  XR) 30 MG 24 hr capsule Take 2 capsules (60 mg total) by mouth every morning. Take  1 capsule at 7am and one at 1pm daily   busPIRone  (BUSPAR ) 15 MG tablet Take 1 tablet (15 mg total) by mouth 2 (two) times daily.   escitalopram  (LEXAPRO ) 20 MG tablet Take 1 tablet (20 mg total) by mouth daily.  fluticasone  (FLONASE ) 50 MCG/ACT nasal spray Place 2 sprays into both nostrils daily.   JUNEL 1.5/30 1.5-30 MG-MCG tablet Take 1 tablet by mouth daily.   nebivolol  (BYSTOLIC ) 5 MG tablet TAKE 1 TABLET (5 MG TOTAL) BY MOUTH DAILY.   Tazarotene  (FABIOR ) 0.1 % FOAM Apply 1 application topically daily.   traZODone  (DESYREL ) 50 MG tablet TAKE ONE TABLET AT BEDTIME   No facility-administered encounter medications on file as of 10/29/2023.    No Known Allergies  Pertinent ROS per HPI, otherwise unremarkable      Objective:  BP 111/70   Pulse 92   Temp 97.7 F (36.5 C)   Ht 5' 8 (1.727 m)   Wt 153 lb 3.2 oz (69.5 kg)   SpO2 98%   BMI 23.29 kg/m     Wt Readings from Last 3 Encounters:  10/29/23 153 lb 3.2 oz (69.5 kg)  09/12/23 159 lb (72.1 kg)  06/11/23 157 lb (71.2 kg)    Physical Exam Vitals and nursing note reviewed.  Constitutional:      General: She is not in acute distress.    Appearance: Normal appearance. She is normal weight. She is not ill-appearing, toxic-appearing or diaphoretic.  HENT:     Head: Normocephalic and atraumatic.     Right Ear: A middle ear effusion is present. Tympanic membrane is not erythematous.     Left Ear: A middle ear effusion is present. Tympanic membrane is not erythematous.     Nose: Congestion present.     Right Sinus: No maxillary sinus tenderness or frontal sinus tenderness.     Left Sinus: No maxillary sinus tenderness or frontal sinus tenderness.     Mouth/Throat:     Lips: Pink.     Mouth: Mucous membranes are moist.     Pharynx: Postnasal drip present. No pharyngeal swelling, oropharyngeal exudate, posterior oropharyngeal erythema or uvula swelling.  Eyes:     Conjunctiva/sclera: Conjunctivae normal.     Pupils: Pupils are equal, round, and reactive to light.  Cardiovascular:     Rate and Rhythm: Normal rate and regular rhythm.     Heart sounds: Normal heart sounds.  Pulmonary:     Effort: Pulmonary effort is normal.     Breath sounds: Normal breath sounds.  Musculoskeletal:     Cervical back: Neck supple.     Right lower leg: No edema.     Left lower leg: No edema.  Skin:    General: Skin is warm and dry.     Capillary Refill: Capillary refill takes less than 2 seconds.  Neurological:     General: No focal deficit present.     Mental Status: She is alert and oriented to person, place, and time.  Psychiatric:        Mood and Affect: Mood normal.        Behavior: Behavior normal.        Thought Content: Thought content normal.        Judgment: Judgment normal.     Results for orders placed or performed during the hospital encounter of 01/02/23  Resp panel by RT-PCR  (RSV, Flu A&B, Covid) Anterior Nasal Swab   Collection Time: 01/02/23 12:31 PM   Specimen: Anterior Nasal Swab  Result Value Ref Range   SARS Coronavirus 2 by RT PCR NEGATIVE NEGATIVE   Influenza A by PCR NEGATIVE NEGATIVE   Influenza B by PCR NEGATIVE NEGATIVE   Resp Syncytial Virus by PCR POSITIVE (A) NEGATIVE  Pertinent labs & imaging results that were available during my care of the patient were reviewed by me and considered in my medical decision making.  Assessment & Plan:  Keonna was seen today for nasal congestion, ear pain and chills.  Diagnoses and all orders for this visit:  URI with cough and congestion -     fluticasone  (FLONASE ) 50 MCG/ACT nasal spray; Place 2 sprays into both nostrils daily.     Acute upper respiratory infection with nasal congestion and ear fullness Symptoms consistent with an acute upper respiratory infection, including nasal congestion and ear fullness, likely viral etiology. Differential includes COVID-19, RSV, and influenza. No fever, cough, or shortness of breath. Dizziness likely due to Mucinex DM use. Inconsistent use of Flonase  may contribute to persistent symptoms. Antiviral therapy not indicated as symptoms have been present for over a week. - Advise use of Flonase  every morning, two squirts in each nostril, for two weeks to reduce nasal congestion and ear fullness. - Discontinue Mucinex DM; recommend plain Mucinex with increased water intake to help clear congestion. - Use Tylenol or Motrin  as needed for fever, muscle aches, or discomfort. - Declined viral testing.       Continue all other maintenance medications.  Follow up plan: Return if symptoms worsen or fail to improve.   Continue healthy lifestyle choices, including diet (rich in fruits, vegetables, and lean proteins, and low in salt and simple carbohydrates) and exercise (at least 30 minutes of moderate physical activity daily).  Educational handout given for viral  URI  The above assessment and management plan was discussed with the patient. The patient verbalized understanding of and has agreed to the management plan. Patient is aware to call the clinic if they develop any new symptoms or if symptoms persist or worsen. Patient is aware when to return to the clinic for a follow-up visit. Patient educated on when it is appropriate to go to the emergency department.   Rosaline Bruns, FNP-C [facility] [phone]

## 2023-12-06 ENCOUNTER — Other Ambulatory Visit: Payer: Self-pay | Admitting: Nurse Practitioner

## 2023-12-06 DIAGNOSIS — F331 Major depressive disorder, recurrent, moderate: Secondary | ICD-10-CM

## 2023-12-06 DIAGNOSIS — J069 Acute upper respiratory infection, unspecified: Secondary | ICD-10-CM

## 2023-12-06 DIAGNOSIS — F411 Generalized anxiety disorder: Secondary | ICD-10-CM

## 2023-12-06 DIAGNOSIS — F5104 Psychophysiologic insomnia: Secondary | ICD-10-CM

## 2023-12-12 ENCOUNTER — Ambulatory Visit: Payer: Self-pay | Admitting: Nurse Practitioner

## 2023-12-16 ENCOUNTER — Encounter: Payer: Self-pay | Admitting: Nurse Practitioner

## 2023-12-27 ENCOUNTER — Ambulatory Visit: Admitting: Nurse Practitioner

## 2023-12-30 ENCOUNTER — Encounter: Payer: Self-pay | Admitting: Nurse Practitioner

## 2023-12-30 ENCOUNTER — Other Ambulatory Visit: Payer: Self-pay | Admitting: Nurse Practitioner

## 2023-12-30 ENCOUNTER — Ambulatory Visit: Admitting: Nurse Practitioner

## 2023-12-30 DIAGNOSIS — F331 Major depressive disorder, recurrent, moderate: Secondary | ICD-10-CM

## 2023-12-30 DIAGNOSIS — F5104 Psychophysiologic insomnia: Secondary | ICD-10-CM

## 2023-12-30 DIAGNOSIS — L700 Acne vulgaris: Secondary | ICD-10-CM

## 2023-12-30 DIAGNOSIS — F411 Generalized anxiety disorder: Secondary | ICD-10-CM

## 2023-12-30 DIAGNOSIS — R000 Tachycardia, unspecified: Secondary | ICD-10-CM

## 2023-12-30 DIAGNOSIS — F902 Attention-deficit hyperactivity disorder, combined type: Secondary | ICD-10-CM

## 2023-12-30 MED ORDER — NEBIVOLOL HCL 5 MG PO TABS: 5.0000 mg | ORAL_TABLET | Freq: Every day | ORAL | 1 refills | Status: AC

## 2023-12-30 MED ORDER — AMPHETAMINE-DEXTROAMPHET ER 30 MG PO CP24: 60.0000 mg | ORAL_CAPSULE | Freq: Every day | ORAL | 0 refills | Status: AC

## 2023-12-30 MED ORDER — BUSPIRONE HCL 15 MG PO TABS: 15.0000 mg | ORAL_TABLET | Freq: Two times a day (BID) | ORAL | 1 refills | Status: AC

## 2023-12-30 MED ORDER — ESCITALOPRAM OXALATE 20 MG PO TABS: 20.0000 mg | ORAL_TABLET | Freq: Every day | ORAL | 1 refills | Status: AC

## 2023-12-30 MED ORDER — TAZAROTENE 0.1 % EX FOAM: 1.0000 "application " | Freq: Every day | CUTANEOUS | 6 refills | Status: AC

## 2023-12-30 MED ORDER — TRAZODONE HCL 50 MG PO TABS: ORAL_TABLET | ORAL | 1 refills | Status: AC

## 2023-12-30 MED ORDER — AMPHETAMINE-DEXTROAMPHET ER 30 MG PO CP24: 60.0000 mg | ORAL_CAPSULE | ORAL | 0 refills | Status: AC

## 2023-12-30 NOTE — Progress Notes (Signed)
 "  Subjective:    Patient ID: Ruth Petty, female    DOB: 04-08-1995, 28 y.o.   MRN: [id number]   Chief Complaint: medical management of chronic issues    Ruth Petty  1. Attention deficit hyperactivity disorder (ADHD), combined type Patient has been on adhd meds since high school. 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 She is on adderall  30mg  daily and is doing well.  2. Moderate episode of recurrent major depressive disorder (HCC) Is on lexapro  and is doing well    12/30/2023    9:55 AM 09/12/2023    4:06 PM 06/11/2023    3:33 PM  Depression screen PHQ 2/9  Decreased Interest 1 0 0  Down, Depressed, Hopeless 1 1 1   PHQ - 2 Score 2 1 1   Altered sleeping 1 1   Tired, decreased energy 1 1   Change in appetite 1 1   Feeling bad or failure about yourself  1 1   Trouble concentrating 0 0   Moving slowly or fidgety/restless 0 0   Suicidal thoughts 0 0   PHQ-9 Score 6 5    Difficult doing work/chores Somewhat difficult Somewhat difficult      Data saved with a previous flowsheet row definition        3. Generalized anxiety disorder Take buspar  as needed.    12/30/2023    9:55 AM 09/12/2023    4:06 PM 06/11/2023    3:34 PM 03/14/2023    3:35 PM  GAD 7 : Generalized Anxiety Score  Nervous, Anxious, on Edge 1 1 1 1   Control/stop worrying 1 1 1 1   Worry too much - different things 1 1 1 1   Trouble relaxing 1 1 1 1   Restless 0 0 0 0  Easily annoyed or irritable 1 1 1 1   Afraid - awful might happen 1 1 1 1   Total GAD 7 Score 6 6 6 6   Anxiety Difficulty Somewhat difficult Somewhat difficult Somewhat difficult Somewhat difficult       4. 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 Has been sleeping well. Does not take meds every night   5. Sinus tachycardia Is on bystolic  and is doing well.  Patient Active Problem List   Diagnosis Date Noted   Sinus tachycardia 09/12/2023   Depression 08/10/2015   Generalized anxiety  disorder 08/10/2015   ADD (attention deficit disorder) 04/24/2012    Wt Readings from Last 3 Encounters:  12/30/23 154 lb (69.9 kg)  10/29/23 153 lb 3.2 oz (69.5 kg)  09/12/23 159 lb (72.1 kg)   BMI Readings from Last 3 Encounters:  12/30/23 23.42 kg/m  10/29/23 23.29 kg/m  09/12/23 24.18 kg/m      Review of Systems  Constitutional:  Negative for diaphoresis.  Eyes:  Negative for pain.  Respiratory:  Negative for shortness of breath.   Cardiovascular:  Negative for chest pain, palpitations and leg swelling.  Gastrointestinal:  Negative for abdominal pain.  Endocrine: Negative for polydipsia.  Skin:  Negative for rash.  Neurological:  Negative for dizziness, weakness and headaches.  Hematological:  Does not bruise/bleed easily.  All other systems reviewed and are negative.      Objective:   Physical Exam Vitals and nursing note reviewed.  Constitutional:      General: She is not in acute distress.    Appearance: Normal appearance. She is well-developed.  Neck:     Vascular: No carotid bruit or JVD.  Cardiovascular:     Rate and Rhythm: Normal rate and regular rhythm.     Heart sounds: Normal heart sounds.  Pulmonary:     Effort: Pulmonary effort is normal. No respiratory distress.     Breath sounds: Normal breath sounds. No wheezing or rales.  Chest:     Chest wall: No tenderness.  Abdominal:     General: Bowel sounds are normal. There is no distension or abdominal bruit.     Palpations: Abdomen is soft. There is no hepatomegaly, splenomegaly, mass or pulsatile mass.     Tenderness: There is no abdominal tenderness.  Musculoskeletal:        General: Normal range of motion.     Cervical back: Normal range of motion and neck supple.  Lymphadenopathy:     Cervical: No cervical adenopathy.  Skin:    General: Skin is warm and dry.  Neurological:     Mental Status: She is alert and oriented to person, place, and time.     Deep Tendon Reflexes: Reflexes are  normal and symmetric.  Psychiatric:        Behavior: Behavior normal.        Thought Content: Thought content normal.        Judgment: Judgment normal.    BP 110/78   Pulse 92   Temp 97.7 F (36.5 C) (Temporal)   Ht 5' 8 (1.727 m)   Wt 154 lb (69.9 kg)   SpO2 100%   BMI 23.42 kg/m          Assessment & Plan:  Ruth Petty in today with chief complaint of medical management of chronic issues    1. Attention deficit hyperactivity disorder (ADHD), combined type - amphetamine -dextroamphetamine (ADDERALL  XR) 30 MG 24 hr capsule; Take 2 capsules (60 mg total) by mouth daily. Take 1 capsule by mouth at 7am and one at 1pm daily.  Dispense: 60 capsule; Refill: 0 - amphetamine -dextroamphetamine (ADDERALL  XR) 30 MG 24 hr capsule; Take 2 capsules (60 mg total) by mouth every morning. Take  1 capsule at 7am and one at 1pm daily  Dispense: 60 capsule; Refill: 0 - amphetamine -dextroamphetamine (ADDERALL  XR) 30 MG 24 hr capsule; Take 2 capsules (60 mg total) by mouth every morning. Take  1 capsule at 7am and one at 1pm daily  Dispense: 60 capsule; Refill: 0  2. Moderate episode of recurrent major depressive disorder (HCC) - escitalopram  (LEXAPRO ) 20 MG tablet; Take 1 tablet (20 mg total) by mouth daily.  Dispense: 90 tablet; Refill: 1  3. Generalized anxiety disorder - busPIRone  (BUSPAR ) 15 MG tablet; Take 1 tablet (15 mg total) by mouth 2 (two) times daily. (NEEDS TO BE SEEN BEFORE NEXT REFILL)  Dispense: 60 tablet; Refill: 0  Stress management  The above assessment and management plan was discussed with the patient. The patient verbalized understanding of and has agreed to the management plan. Patient is aware to call the clinic if symptoms persist or worsen. Patient is aware when to return to the clinic for a follow-up visit. Patient educated on when it is appropriate to go to the emergency department.   Mary-Margaret Gladis, FNP    "

## 2023-12-30 NOTE — Patient Instructions (Signed)

## 2023-12-30 NOTE — Telephone Encounter (Signed)
 Name from pharmacy: TAZAROTENE  0.1% FOAM   Pharmacy comment: Alternative Requested:WE ARE NOT ABLE TO GET THIS DRUG. PLEASE SEND ALTERNATIVE. THANK YOU.

## 2023-12-31 MED ORDER — TRETINOIN 0.025 % EX CREA: TOPICAL_CREAM | Freq: Every day | CUTANEOUS | 0 refills | Status: AC

## 2023-12-31 NOTE — Telephone Encounter (Signed)
 Had to change her foam facial treatment to a cream to use 1x daily. Insurance would not pay for what she was previously on. May make face peel at times!!  Meds ordered this encounter  Medications   tretinoin  (RETIN-A ) 0.025 % cream    Sig: Apply topically at bedtime.    Dispense:  45 g    Refill:  0    Supervising Provider:   MARYANNE CHEW A [[id number]]   Mary-Margaret Gladis, FNP

## 2024-01-01 ENCOUNTER — Encounter: Payer: Self-pay | Admitting: Nurse Practitioner

## 2024-01-01 MED ORDER — OSELTAMIVIR PHOSPHATE 75 MG PO CAPS: 75.0000 mg | ORAL_CAPSULE | Freq: Two times a day (BID) | ORAL | 0 refills | Status: AC

## 2024-03-23 ENCOUNTER — Ambulatory Visit: Admitting: Nurse Practitioner
